# Patient Record
Sex: Male | Born: 1965 | Race: White | Hispanic: No | Marital: Married | Smoking: Never smoker
Health system: Southern US, Community
[De-identification: ages and names within clinical notes are randomized; demographics above are authoritative.]

## PROBLEM LIST (undated history)

## (undated) DIAGNOSIS — Z87442 Personal history of urinary calculi: Secondary | ICD-10-CM

## (undated) DIAGNOSIS — K219 Gastro-esophageal reflux disease without esophagitis: Secondary | ICD-10-CM

## (undated) DIAGNOSIS — K222 Esophageal obstruction: Secondary | ICD-10-CM

## (undated) DIAGNOSIS — K649 Unspecified hemorrhoids: Secondary | ICD-10-CM

## (undated) DIAGNOSIS — K5732 Diverticulitis of large intestine without perforation or abscess without bleeding: Secondary | ICD-10-CM

## (undated) DIAGNOSIS — K5790 Diverticulosis of intestine, part unspecified, without perforation or abscess without bleeding: Secondary | ICD-10-CM

## (undated) DIAGNOSIS — K635 Polyp of colon: Secondary | ICD-10-CM

## (undated) HISTORY — DX: Esophageal obstruction: K22.2

## (undated) HISTORY — DX: Unspecified hemorrhoids: K64.9

## (undated) HISTORY — PX: ROTATOR CUFF REPAIR: SHX139

## (undated) HISTORY — DX: Gastro-esophageal reflux disease without esophagitis: K21.9

## (undated) HISTORY — DX: Polyp of colon: K63.5

## (undated) HISTORY — DX: Diverticulitis of large intestine without perforation or abscess without bleeding: K57.32

## (undated) HISTORY — PX: HEMORRHOID BANDING: SHX5850

## (undated) HISTORY — DX: Diverticulosis of intestine, part unspecified, without perforation or abscess without bleeding: K57.90

---
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
## 2003-05-01 ENCOUNTER — Ambulatory Visit (HOSPITAL_COMMUNITY): Admission: RE | Admit: 2003-05-01 | Discharge: 2003-05-01 | Payer: Self-pay | Admitting: Pulmonary Disease

## 2003-05-20 ENCOUNTER — Ambulatory Visit (HOSPITAL_COMMUNITY): Admission: RE | Admit: 2003-05-20 | Discharge: 2003-05-20 | Payer: Self-pay | Admitting: Pulmonary Disease

## 2007-12-25 ENCOUNTER — Ambulatory Visit: Payer: Self-pay | Admitting: Internal Medicine

## 2007-12-30 ENCOUNTER — Ambulatory Visit (HOSPITAL_COMMUNITY): Admission: RE | Admit: 2007-12-30 | Discharge: 2007-12-30 | Payer: Self-pay | Admitting: Internal Medicine

## 2007-12-30 ENCOUNTER — Ambulatory Visit: Payer: Self-pay | Admitting: Internal Medicine

## 2007-12-30 HISTORY — PX: ESOPHAGOGASTRODUODENOSCOPY: SHX1529

## 2007-12-30 HISTORY — PX: COLONOSCOPY: SHX174

## 2009-01-21 ENCOUNTER — Encounter: Payer: Self-pay | Admitting: Urgent Care

## 2010-05-07 ENCOUNTER — Encounter: Payer: Self-pay | Admitting: Pulmonary Disease

## 2010-08-30 NOTE — Op Note (Signed)
NAME:  Juan Griffith, Juan Griffith                ACCOUNT NO.:  [id number]   MEDICAL RECORD NO.:  [id number]          PATIENT TYPE:  AMB   LOCATION:  DAY                           FACILITY:  [facility]   PHYSICIAN:  R. Roetta Sessions, M.D. DATE OF BIRTH:  [birth year]   DATE OF PROCEDURE:  12/30/2007  DATE OF DISCHARGE:                               OPERATIVE REPORT   INDICATIONS FOR PROCEDURE:  A 45 year old gentleman with a recent  episode of blood per rectum, had a couple of episodes, has not had any  further bleeding.  He was having some crampy lower abdominal pain as  well.  These symptoms have subsided.  He also has longstanding  gastroesophageal reflux disease for which he takes Prilosec and Zantac  as needed.  He also takes Advil and BC Powders on occasion.   Diagnostic/screening colonoscopy now being performed.  Mr. Zunker  describes esophageal dysphagia intermittently to solid food and tells me  his brother had to perform a Heimlich maneuver on him at one point in  time and consequently if a lesion is found, at minimum, we will perform  dilation.  Risks, benefits, and alternatives of this approach have been  reviewed.  He is also undergoing diagnostic colonoscopy to further  evaluate hematochezia.  This approach has also been discussed, and all  parties questions answered.   PROCEDURE NOTE:  O2 saturation, blood pressure, pulse, and respirations  were monitored throughout the entire procedure.   CONSCIOUS SEDATION:  Versed 6 mg IV and Demerol 100 mg IV in divided  doses.  Cetacaine spray for topical pharyngeal anesthesia.   FINDINGS:  Examination of tubular esophagus revealed a Schatzki ring and  superimposed esophageal erosions straddling the EG junction.  There were  2 areas of deep erosion, superficial ulceration extending up 3 cm above  the EG junction.  There was no Barrett esophagus.  No evidence of  neoplasm.  EG junction was easily traversed with a diagnostic scope.   Stomach:   Gastric cavity was emptied, insufflated well with air.  Thorough examination of the gastric mucosa including retroflexion of the  proximal stomach esophagogastric junction demonstrated only a small  hiatal hernia.  Remainder of gastric mucosa appeared unremarkable.  Pylorus patent, easily traversed.  Examination of the bulb and second  portion revealed no abnormalities.   Therapeutic/Diagnostic Maneuvers Performed:  Scope was withdrawn.  A 56-  French Maloney dilator was passed to full insertion with ease.  A look  back revealed the ring had been dilated and superficial tear just above  the lower esophageal sphincter, otherwise no apparent complication  related to passage of the dilator.  The patient tolerated the procedure  well and was prepared  for colonoscopy.  Digital rectal exam revealed no  abnormalities.   Endoscopic Findings:  Prep was adequate.   Colon:  Colonic mucosa was surveyed from the rectosigmoid junction  through the left transverse, right colon through the appendiceal  orifice, ileocecal valve, and cecum.  These structures were well seen  and photographed for the record.  Terminal ileum was intubated, 10 cm  from this  level scope was slowly and cautiously withdrawn.  All previous  mentioned mucosal surfaces were again seen.  The patient had extensive  left-sided diverticulum and colonic mucosa appeared normal as did the  terminal ileal mucosa.  Scope was pulled down the rectum.  Thorough  examination of the rectal mucosa including retroflexed view of the anal  verge demonstrated only minimal anal canal hemorrhoids.  The patient  tolerated both procedures well, was reacted in endoscopy.   IMPRESSION:  1. Esophagogastroduodenoscopy.  Schatzki ring with superimposed distal      esophageal erosions/superficial ulcerations consistent with      erosive/ulcerative reflux esophagitis status post dilation of      Schatzki ring as described above, otherwise unremarkable  esophagus,      small hiatal hernia.  Otherwise normal stomach, D1 and D2.      Colonoscopy findings normal rectum aside from minimal anal canal      hemorrhoids.  2. Extensive left-sided diverticulum.  Colonic mucosa appeared normal,      terminal ileum.   RECOMMENDATIONS:  1. Stop Prilosec and p.r.n. Zantac.  2. Begin Zegerid 40 mg 1 capsule daily before breakfast, prescription      given.  Antireflux literature provided to Mr. Mcmanaman.  3. Diverticulosis literature provided to Mr. Lupo.  He also is to      take daily fiber supplement.   Etiology was self-limiting hematochezia is not clear.  He certainly  could have had an episode of ischemic or infectious colitis.  In  addition, diverticular bleeds were not excluded either.   NSAID effect could also be a contributing factor.   We will plan to see Mr. Arwood back in the office in 1 month and see how  he is doing from there.      Jonathon Bellows, M.D.  Electronically Signed     RMR/MEDQ  D:  12/30/2007  T:  12/31/2007  Job:  [id number]   cc:   Ramon Dredge L. Juanetta Gosling, M.D.  Fax: [phone]

## 2010-08-30 NOTE — Consult Note (Signed)
NAME:  Juan Griffith, Juan Griffith                ACCOUNT NO.:  [id number]   MEDICAL RECORD NO.:  [id number]          PATIENT TYPE:  AMB   LOCATION:  DAY                           FACILITY:  [facility]   PHYSICIAN:  R. Roetta Sessions, M.D. DATE OF BIRTH:  05-25-1965   DATE OF CONSULTATION:  DATE OF DISCHARGE:                                 CONSULTATION   REQUESTING PHYSICIAN:  Dr. Juanetta Gosling.   CONSULTING PHYSICIAN:  R. Roetta Sessions, MD   REASON FOR CONSULTATION:  Hematochezia.   HISTORY OF PRESENT ILLNESS:  Mr. Parkerson is a 45 year old Caucasian male.  He has had 2 episodes of large volume hematochezia, first was about 6  weeks ago and second about 2 weeks ago, where he noticed a large amount  of bright red blood in the commode as well as on the toilet paper.  He  tells me he has had stuttering small-volume hematochezia on the toilet  paper for a while now, but these 2 large episodes is what made him come  to the doctor.  He was seen by Dr. Juanetta Gosling.  He has had some lab work  and for some we do not have the results.  He has diarrhea at least half  of the week.  He will have anywhere from 3-5 loose stools that are  watery per day.  He also has cramp and intermittent knife-like low  abdominal pain at times that is usually resolved with defecation.  He  has had some transient nausea.  Denies any vomiting.  He has almost  daily heartburn and indigestion most weeks, other weeks he only has  episodes a couple of times per week.  He takes on demand Prilosec and/or  Zantac.  He uses a couple of Advil per month and rare BC powders.  He  complains of anorexia since the episode of rectal bleeding.  His weight  has remained stable.  He has had chronic reflux symptoms for a couple of  years now.   PAST MEDICAL AND SURGICAL HISTORY:  Chronic GERD.  He has had no  surgery.   CURRENT MEDICATIONS:  1. Prilosec 20 mg p.r.n.  2. Zantac 75 mg p.r.n.  3. Advil 200 mg p.r.n.  4. BC powders p.r.n.   ALLERGIES:   CODEINE and SULFA.   FAMILY HISTORY:  There is no known family history of colorectal  carcinoma, liver, or chronic GI problems.  Mother has diabetes mellitus.  Father is healthy.  He has 1 healthy brother.   SOCIAL HISTORY:  Mr. Dorner is married.  He has 2 healthy children.  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  He has dipped  tobacco for about 25 years now.  He quit drinking alcohol 13 years ago  after drinking heavily and having episodes of hematemesis.  He denies  any drug use.   REVIEW OF SYSTEMS:  See HPI, otherwise negative.   PHYSICAL EXAMINATION:  VITAL SIGNS:  Weight 212 pounds, height 73-93  inches, blood pressure 112/80, and pulse 60.  GENERAL:  Mr. Monica is well-developed and well-nourished Caucasian male  in no acute distress.  HEENT:  Sclerae clear, nonicteric. Conjunctivae pink. Oropharynx pink  and moist without any lesions.  NECK:  Supple without mass or thyromegaly.  CHEST:  Heart regular rhythm.  Normal S1 and S2.  No murmurs, clicks,  rubs, or gallops.  LUNGS:  Clear to auscultation bilaterally.  ABDOMEN:  Positive bowel sounds x4.  No bruits auscultated.  Soft,  nontender, nondistended without palpable mass or hepatosplenomegaly.  No  rebound tenderness or guarding.  EXTREMITIES:  Without clubbing or edema.  RECTAL:  Deferred.   IMPRESSION:  Mr. Byrns is a 45 year old Caucasian male who has had 2  episodes of large volume hematochezia.  He also has chronic intermittent  diarrhea with some cramp-like abdominal pain.  He has also had chronic  gastroesophageal reflux disease and has been taking p.r.n. proton pump  inhibitor and H2 blockers.   He is going to require further evaluation to determine etiology of his  hematochezia, which could be due to benign anorectal source such as  hemorrhoids, fissure, or diverticular bleeding, however, we do need to  rule out colorectal carcinoma.   As far as his chronic gastroesophageal reflux  disease is concerned, he  should have screening for Barrett and also given his frequent symptoms,  he may benefit from daily PPI therapy, since he is having symptoms more  days than not.   PLAN:  1. We would consider daily PPI, pending EGD.  2. Diagnostic colonoscopy and screening EGD with Dr. Jena Gauss in the near      future.  I discussed both procedures including risks and benefits,      including but not limited to bleeding, infection, perforation, and      drug reaction.  He agreed with the plan and consent was obtained.  3. We will request recent labs from Dr. Juanetta Gosling office for review.   Thank you Dr. Juanetta Gosling for allowing [place] to participate in the care of Mr.  Frith.        Lorenza Burton, N.P.      Jonathon Bellows, M.D.  Electronically Signed    KJ/MEDQ  D:  12/25/2007  T:  12/25/2007  Job:  [id number]

## 2013-04-22 ENCOUNTER — Encounter: Payer: Self-pay | Admitting: Gastroenterology

## 2013-04-22 ENCOUNTER — Ambulatory Visit (INDEPENDENT_AMBULATORY_CARE_PROVIDER_SITE_OTHER): Payer: 59 | Admitting: Gastroenterology

## 2013-04-22 ENCOUNTER — Encounter (INDEPENDENT_AMBULATORY_CARE_PROVIDER_SITE_OTHER): Payer: Self-pay

## 2013-04-22 ENCOUNTER — Other Ambulatory Visit: Payer: Self-pay | Admitting: Internal Medicine

## 2013-04-22 VITALS — BP 125/78 | HR 71 | Temp 97.6°F | Ht 73.0 in | Wt 223.0 lb

## 2013-04-22 DIAGNOSIS — K625 Hemorrhage of anus and rectum: Secondary | ICD-10-CM | POA: Insufficient documentation

## 2013-04-22 DIAGNOSIS — K219 Gastro-esophageal reflux disease without esophagitis: Secondary | ICD-10-CM | POA: Insufficient documentation

## 2013-04-22 LAB — CBC WITH DIFFERENTIAL/PLATELET
Basophils Absolute: 0 10*3/uL (ref 0.0–0.1)
Basophils Relative: 0 % (ref 0–1)
Eosinophils Absolute: 0.3 10*3/uL (ref 0.0–0.7)
Eosinophils Relative: 3 % (ref 0–5)
HCT: 40.7 % (ref 39.0–52.0)
Hemoglobin: 14.3 g/dL (ref 13.0–17.0)
Lymphocytes Relative: 28 % (ref 12–46)
Lymphs Abs: 2.4 10*3/uL (ref 0.7–4.0)
MCH: 30.8 pg (ref 26.0–34.0)
MCHC: 35.1 g/dL (ref 30.0–36.0)
MCV: 87.7 fL (ref 78.0–100.0)
Monocytes Absolute: 0.8 10*3/uL (ref 0.1–1.0)
Monocytes Relative: 10 % (ref 3–12)
Neutro Abs: 5.1 10*3/uL (ref 1.7–7.7)
Neutrophils Relative %: 59 % (ref 43–77)
Platelets: 259 10*3/uL (ref 150–400)
RBC: 4.64 MIL/uL (ref 4.22–5.81)
RDW: 13.7 % (ref 11.5–15.5)
WBC: 8.6 10*3/uL (ref 4.0–10.5)

## 2013-04-22 MED ORDER — PEG 3350-KCL-NA BICARB-NACL 420 G PO SOLR: 4000.0000 mL | ORAL | Status: DC

## 2013-04-22 MED ORDER — OMEPRAZOLE MAGNESIUM 20 MG PO TBEC: 20.0000 mg | DELAYED_RELEASE_TABLET | Freq: Every day | ORAL | Status: AC

## 2013-04-22 NOTE — Progress Notes (Signed)
cc'd to pcp 

## 2013-04-22 NOTE — Assessment & Plan Note (Signed)
Well-controlled with over-the-counter PPI therapy. Patient has taken Zegerid OTC up to 3 daily. Advised against this given the significant amount of sodium bicarbonate consumption at this dose. Advised that he consider Prilosec OTC, Prevacid 24 hour, or Nexium OTC one in the morning and one in the evening.

## 2013-04-22 NOTE — Patient Instructions (Addendum)
1. We have scheduled you for a colonoscopy with Dr. Gala Romney. See separate instructions.  2. Stop taking too much Zegerid OTC due to excessive salt intake. Try Prilosec, Prevacid, Nexium over the counter.  3. Please have your blood work done.

## 2013-04-22 NOTE — Progress Notes (Signed)
Primary Care Physician:  Alonza Bogus, MD  Primary Gastroenterologist:  Garfield Cornea, MD   Chief Complaint  Patient presents with  . Rectal Bleeding  . Abdominal Pain    HPI:  Juan Griffith is a 48 y.o. male here frequent hematachezia. Some large volume bleeding with clots. Has had bleeding without any warning and ruined clothing. BM 2-3 is normal for him. Right now 5-6 stools, but mostly blood. Bleeding has increased over the past 2 months. Notes that he has bleeding almost 10 months per year. No constipation. Stool formed to loose. Some cramping in abdomen with this. No heartburn on Zegerid OTC 20mg  2 in the morning and one at night. Rare abdominal pain sometimes more on the left. No swallowing problems. Denies any weight loss. No family history of colon cancer or inflammatory bowel disease.  Current Outpatient Prescriptions  Medication Sig Dispense Refill  . Omeprazole-Sodium Bicarbonate (ZEGERID OTC PO) Take by mouth 3 (three) times daily.       No current facility-administered medications for this visit.    Allergies as of 04/22/2013 - Review Complete 04/22/2013  Allergen Reaction Noted  . Codeine  04/22/2013  . Sulfa antibiotics  04/22/2013    Past Medical History  Diagnosis Date  . GERD (gastroesophageal reflux disease)   . Diverticulosis   . Schatzki's ring     s/p dilation 2009    Past Surgical History  Procedure Laterality Date  . Colonoscopy  12/30/2007    [facility]:ENIDPO rectum aside from minimal anal canal hemorrhoids/Extensive left-sided diverticulum.  Colonic mucosa appeared normal  . Esophagogastroduodenoscopy  12/30/2007    [facility]:PNTIRWER ring with superimposed distal esophageal erosions/superficial ulcerations consistent with erosive/ulcerative reflux esophagitis status post dilation of Schatzki ring /small HH    Family History  Problem Relation Age of Onset  . Colon cancer Neg Hx     History   Social History  . Marital Status: Married    Spouse  Name: N/A    Number of Children: 2  . Years of Education: N/A   Occupational History  . electrician    Social History Main Topics  . Smoking status: Never Smoker   . Smokeless tobacco: Current User     Comment: dips  . Alcohol Use: Yes     Comment: every 6 months   . Drug Use: No  . Sexual Activity: Not on file   Other Topics Concern  . Not on file   Social History Narrative  . No narrative on file      ROS:  General: Negative for anorexia, weight loss, fever, chills, fatigue, weakness. Eyes: Negative for vision changes.  ENT: Negative for hoarseness, difficulty swallowing , nasal congestion. CV: Negative for chest pain, angina, palpitations, dyspnea on exertion, peripheral edema.  Respiratory: Negative for dyspnea at rest, dyspnea on exertion, cough, sputum, wheezing.  GI: See history of present illness. GU:  Negative for dysuria, hematuria, urinary incontinence, urinary frequency, nocturnal urination.  MS: Negative for joint pain, low back pain.  Derm: Negative for rash or itching.  Neuro: Negative for weakness, abnormal sensation, seizure, frequent headaches, memory loss, confusion.  Psych: Negative for anxiety, depression, suicidal ideation, hallucinations.  Endo: Negative for unusual weight change.  Heme: Negative for bruising or bleeding. Allergy: Negative for rash or hives.    Physical Examination:  BP 125/78  Pulse 71  Temp(Src) 97.6 F (36.4 C) (Oral)  Ht 6\' 1"  (1.854 m)  Wt 223 lb (101.152 kg)  BMI 29.43 kg/m2   General:  Well-nourished, well-developed in no acute distress.  Head: Normocephalic, atraumatic.   Eyes: Conjunctiva pink, no icterus. Mouth: Oropharyngeal mucosa moist and pink , no lesions erythema or exudate. Neck: Supple without thyromegaly, masses, or lymphadenopathy.  Lungs: Clear to auscultation bilaterally.  Heart: Regular rate and rhythm, no murmurs rubs or gallops.  Abdomen: Bowel sounds are normal, minimal left lower quadrant  tenderness, nondistended, no hepatosplenomegaly or masses, no abdominal bruits or    hernia , no rebound or guarding.   Rectal: Deferred Extremities: No lower extremity edema. No clubbing or deformities.  Neuro: Alert and oriented x 4 , grossly normal neurologically.  Skin: Warm and dry, no rash or jaundice.   Psych: Alert and cooperative, normal mood and affect.

## 2013-04-22 NOTE — Assessment & Plan Note (Addendum)
48 y/o male with recurrent large volume hematochezia associated with minimal abdominal pain, mostly left sided. Frequency of bleeding has increased over the past couple of months. A 5-6 bloody bowel movements daily. Often passes only blood. Complains of passing small blood clots. Last colonoscopy 2009, at which time he had dense left-sided diverticula and minimal internal hemorrhoids. Question recurrent diverticular bleed. Need to exclude IBD or malignancy. Evaluate hemorrhoids for possible banding. Recommend colonoscopy at this time for further evaluation.  I have discussed the risks, alternatives, benefits with regards to but not limited to the risk of reaction to medication, bleeding, infection, perforation and the patient is agreeable to proceed. Written consent to be obtained.  CBC.

## 2013-04-25 ENCOUNTER — Encounter (HOSPITAL_COMMUNITY)
Admission: RE | Disposition: A | Discharge status: Home / Self Care | Payer: Self-pay | Source: Ambulatory Visit | Attending: Internal Medicine

## 2013-04-25 ENCOUNTER — Ambulatory Visit (HOSPITAL_COMMUNITY)
Admission: RE | Admit: 2013-04-25 | Discharge: 2013-04-25 | Disposition: A | Discharge status: Home / Self Care | Payer: 59 | Source: Ambulatory Visit | Attending: Internal Medicine | Admitting: Internal Medicine

## 2013-04-25 ENCOUNTER — Encounter (HOSPITAL_COMMUNITY): Payer: Self-pay | Admitting: *Deleted

## 2013-04-25 DIAGNOSIS — K625 Hemorrhage of anus and rectum: Secondary | ICD-10-CM

## 2013-04-25 DIAGNOSIS — K921 Melena: Secondary | ICD-10-CM

## 2013-04-25 DIAGNOSIS — D129 Benign neoplasm of anus and anal canal: Principal | ICD-10-CM

## 2013-04-25 DIAGNOSIS — K5732 Diverticulitis of large intestine without perforation or abscess without bleeding: Secondary | ICD-10-CM

## 2013-04-25 DIAGNOSIS — K648 Other hemorrhoids: Secondary | ICD-10-CM | POA: Insufficient documentation

## 2013-04-25 DIAGNOSIS — D128 Benign neoplasm of rectum: Secondary | ICD-10-CM | POA: Insufficient documentation

## 2013-04-25 DIAGNOSIS — K219 Gastro-esophageal reflux disease without esophagitis: Secondary | ICD-10-CM

## 2013-04-25 DIAGNOSIS — K5730 Diverticulosis of large intestine without perforation or abscess without bleeding: Secondary | ICD-10-CM | POA: Insufficient documentation

## 2013-04-25 HISTORY — PX: COLONOSCOPY: SHX5424

## 2013-04-25 SURGERY — COLONOSCOPY
Anesthesia: Moderate Sedation

## 2013-04-25 MED ORDER — SODIUM CHLORIDE 0.9 % IV SOLN
INTRAVENOUS | Status: DC
  Administered 2013-04-25: 12:00:00 via INTRAVENOUS

## 2013-04-25 MED ORDER — MIDAZOLAM HCL 5 MG/5ML IJ SOLN
INTRAMUSCULAR | Status: DC | PRN
  Administered 2013-04-25: 2 mg via INTRAVENOUS
  Administered 2013-04-25 (×2): 1 mg via INTRAVENOUS
  Administered 2013-04-25: 2 mg via INTRAVENOUS

## 2013-04-25 MED ORDER — MIDAZOLAM HCL 5 MG/5ML IJ SOLN
INTRAMUSCULAR | Status: AC
  Filled 2013-04-25: qty 10

## 2013-04-25 MED ORDER — ONDANSETRON HCL 4 MG/2ML IJ SOLN
INTRAMUSCULAR | Status: AC
  Filled 2013-04-25: qty 2

## 2013-04-25 MED ORDER — STERILE WATER FOR IRRIGATION IR SOLN
Status: DC | PRN
  Administered 2013-04-25: 13:00:00

## 2013-04-25 MED ORDER — MEPERIDINE HCL 100 MG/ML IJ SOLN
INTRAMUSCULAR | Status: DC | PRN
  Administered 2013-04-25: 25 mg via INTRAVENOUS
  Administered 2013-04-25 (×2): 50 mg via INTRAVENOUS

## 2013-04-25 MED ORDER — MEPERIDINE HCL 100 MG/ML IJ SOLN
INTRAMUSCULAR | Status: AC
  Filled 2013-04-25: qty 2

## 2013-04-25 NOTE — Op Note (Signed)
[place], [address]   COLONOSCOPY PROCEDURE REPORT  PATIENT: Juan Griffith, Juan Griffith  MR#:         [id number] BIRTHDATE: [birth year] , 72  yrs. old GENDER: Male ENDOSCOPIST: R.  Garfield Cornea, MD FACP FACG REFERRED BY:  Sinda Du, M.D. PROCEDURE DATE:  04/25/2013 PROCEDURE:     Colonoscopy with biopsy  INDICATIONS: hematochezia with entirely normal H&H and white count recently  INFORMED CONSENT:  The risks, benefits, alternatives and imponderables including but not limited to bleeding, perforation as well as the possibility of a missed lesion have been reviewed.  The potential for biopsy, lesion removal, etc. have also been discussed.  Questions have been answered.  All parties agreeable. Please see the history and physical in the medical record for more information.  MEDICATIONS: Versed 6 mg IV and Demerol 125 mg IV in divided doses. Zofran 4 mg IV  DESCRIPTION OF PROCEDURE:  After a digital rectal exam was performed, the EC-[id number] ([id number])  colonoscope was advanced from the anus through the rectum and colon to the area of the cecum, ileocecal valve and appendiceal orifice.  The cecum was deeply intubated.  These structures were well-seen and photographed for the record.  From the level of the cecum and ileocecal valve, the scope was slowly and cautiously withdrawn.  The mucosal surfaces were carefully surveyed utilizing scope tip deflection to facilitate fold flattening as needed.  The scope was pulled down into the rectum where a thorough examination including retroflexion was performed.    FINDINGS:  Adequate preparation. Normal rectum aside from inflamed anal canal and internal hemorrhoids. Somewhat tight sphincter time. (1) diminutive rectosigmoid polyp. Patient had extensive left-sided diverticula.  There was a 10-15 segment band of abnormal sigmoid colon mucosa. Mucosa appeared swollen and some pus noted to be coming out of  a couple of tics at this level. I did not see anything concerning for a neoplastic process. More proximally, the remainder of the colonic mucosa to the cecum appeared entirely normal.  THERAPEUTIC / DIAGNOSTIC MANEUVERS PERFORMED:  The above-mentioned polyp was cold biopsied/removed  COMPLICATIONS: None  CECAL WITHDRAWAL TIME:  7 minute  IMPRESSION:  Engorged internal hemorrhoids  - likely source of hematochezia. Diminutive rectosigmoid polyp  - status post cold biopsy removal Endoscopic findings consistent with left-sided diverticulosis and sigmoid diverticulitis  RECOMMENDATIONS: Low residue diet. Ten-day course of oral Cipro and Flagyl.  Appointment in the office for hemorrhoid banding in about 4 weeks from now. Followup on pathology.   _______________________________ eSigned:  R. Garfield Cornea, MD FACP [facility] 04/25/2013 1:47 PM   CC:    PATIENT NAME:  Noeh, Sparacino MR#: [id number]

## 2013-04-25 NOTE — Discharge Instructions (Addendum)
Colonoscopy Discharge Instructions  Read the instructions outlined below and refer to this sheet in the next few weeks. These discharge instructions provide you with general information on caring for yourself after you leave the hospital. Your doctor may also give you specific instructions. While your treatment has been planned according to the most current medical practices available, unavoidable complications occasionally occur. If you have any problems or questions after discharge, call Dr. Gala Romney at [phone]. ACTIVITY  You may resume your regular activity, but move at a slower pace for the next 24 hours.   Take frequent rest periods for the next 24 hours.   Walking will help get rid of the air and reduce the bloated feeling in your belly (abdomen).   No driving for 24 hours (because of the medicine (anesthesia) used during the test).    Do not sign any important legal documents or operate any machinery for 24 hours (because of the anesthesia used during the test).  NUTRITION  Drink plenty of fluids.   You may resume your normal diet as instructed by your doctor.   Begin with a light meal and progress to your normal diet. Heavy or fried foods are harder to digest and may make you feel sick to your stomach (nauseated).   Avoid alcoholic beverages for 24 hours or as instructed.  MEDICATIONS  You may resume your normal medications unless your doctor tells you otherwise.  WHAT YOU CAN EXPECT TODAY  Some feelings of bloating in the abdomen.   Passage of more gas than usual.   Spotting of blood in your stool or on the toilet paper.  IF YOU HAD POLYPS REMOVED DURING THE COLONOSCOPY:  No aspirin products for 7 days or as instructed.   No alcohol for 7 days or as instructed.   Eat a soft diet for the next 24 hours.  FINDING OUT THE RESULTS OF YOUR TEST Not all test results are available during your visit. If your test results are not back during the visit, make an appointment  with your caregiver to find out the results. Do not assume everything is normal if you have not heard from your caregiver or the medical facility. It is important for you to follow up on all of your test results.  SEEK IMMEDIATE MEDICAL ATTENTION IF:  You have more than a spotting of blood in your stool.   Your belly is swollen (abdominal distention).   You are nauseated or vomiting.   You have a temperature over 101.   You have abdominal pain or discomfort that is severe or gets worse throughout the day.    Hemorrhoid, polyp, diverticulosis and diverticulitis information provided  Low residue diet  Begin Cipro 500 mg orally twice daily x10 days and Flagyl 500 mg orally 3 times daily for 10 days  Further recommendations to follow pending review of pathology report  Office visit with [place] in 4 weeks for hemorrhoid banding  Hemorrhoids Hemorrhoids are swollen veins around the rectum or anus. There are two types of hemorrhoids:   Internal hemorrhoids. These occur in the veins just inside the rectum. They may poke through to the outside and become irritated and painful.  External hemorrhoids. These occur in the veins outside the anus and can be felt as a painful swelling or hard lump near the anus. CAUSES  Pregnancy.   Obesity.   Constipation or diarrhea.   Straining to have a bowel movement.   Sitting for long periods on the toilet.  Heavy  lifting or other activity that caused you to strain.  Anal intercourse. SYMPTOMS   Pain.   Anal itching or irritation.   Rectal bleeding.   Fecal leakage.   Anal swelling.   One or more lumps around the anus.  DIAGNOSIS  Your caregiver may be able to diagnose hemorrhoids by visual examination. Other examinations or tests that may be performed include:   Examination of the rectal area with a gloved hand (digital rectal exam).   Examination of anal canal using a small tube (scope).   A blood test if you have  lost a significant amount of blood.  A test to look inside the colon (sigmoidoscopy or colonoscopy). TREATMENT Most hemorrhoids can be treated at home. However, if symptoms do not seem to be getting better or if you have a lot of rectal bleeding, your caregiver may perform a procedure to help make the hemorrhoids get smaller or remove them completely. Possible treatments include:   Placing a rubber band at the base of the hemorrhoid to cut off the circulation (rubber band ligation).   Injecting a chemical to shrink the hemorrhoid (sclerotherapy).   Using a tool to burn the hemorrhoid (infrared light therapy).   Surgically removing the hemorrhoid (hemorrhoidectomy).   Stapling the hemorrhoid to block blood flow to the tissue (hemorrhoid stapling).  HOME CARE INSTRUCTIONS   Eat foods with fiber, such as whole grains, beans, nuts, fruits, and vegetables. Ask your doctor about taking products with added fiber in them (fibersupplements).  Increase fluid intake. Drink enough water and fluids to keep your urine clear or pale yellow.   Exercise regularly.   Go to the bathroom when you have the urge to have a bowel movement. Do not wait.   Avoid straining to have bowel movements.   Keep the anal area dry and clean. Use wet toilet paper or moist towelettes after a bowel movement.   Medicated creams and suppositories may be used or applied as directed.   Only take over-the-counter or prescription medicines as directed by your caregiver.   Take warm sitz baths for 15 20 minutes, 3 4 times a day to ease pain and discomfort.   Place ice packs on the hemorrhoids if they are tender and swollen. Using ice packs between sitz baths may be helpful.   Put ice in a plastic bag.   Place a towel between your skin and the bag.   Leave the ice on for 15 20 minutes, 3 4 times a day.   Do not use a donut-shaped pillow or sit on the toilet for long periods. This increases blood  pooling and pain.  SEEK MEDICAL CARE IF:  You have increasing pain and swelling that is not controlled by treatment or medicine.  You have uncontrolled bleeding.  You have difficulty or you are unable to have a bowel movement.  You have pain or inflammation outside the area of the hemorrhoids. MAKE SURE YOU:  Understand these instructions.  Will watch your condition.  Will get help right away if you are not doing well or get worse.  Diverticulosis Diverticulosis is a common condition that develops when small pouches (diverticula) form in the wall of the colon. The risk of diverticulosis increases with age. It happens more often in people who eat a low-fiber diet. Most individuals with diverticulosis have no symptoms. Those individuals with symptoms usually experience abdominal pain, constipation, or loose stools (diarrhea). HOME CARE INSTRUCTIONS   Increase the amount of fiber in your diet  as directed by your caregiver or dietician. This may reduce symptoms of diverticulosis.  Your caregiver may recommend taking a dietary fiber supplement.  Drink at least 6 to 8 glasses of water each day to prevent constipation.  Try not to strain when you have a bowel movement.  Your caregiver may recommend avoiding nuts and seeds to prevent complications, although this is still an uncertain benefit.  Only take over-the-counter or prescription medicines for pain, discomfort, or fever as directed by your caregiver. FOODS WITH HIGH FIBER CONTENT INCLUDE:  Fruits. Apple, peach, pear, tangerine, raisins, prunes.  Vegetables. Brussels sprouts, asparagus, broccoli, cabbage, carrot, cauliflower, romaine lettuce, spinach, summer squash, tomato, winter squash, zucchini.  Starchy Vegetables. Baked beans, kidney beans, lima beans, split peas, lentils, potatoes (with skin).  Grains. Whole wheat bread, brown rice, bran flake cereal, plain oatmeal, white rice, shredded wheat, bran muffins. SEEK  IMMEDIATE MEDICAL CARE IF:   You develop increasing pain or severe bloating.  You have an oral temperature above 102 F (38.9 C), not controlled by medicine.  You develop vomiting or bowel movements that are bloody or black.  Diverticulitis A diverticulum is a small pouch or sac on the colon. Diverticulosis is the presence of these diverticula on the colon. Diverticulitis is the irritation (inflammation) or infection of diverticula. CAUSES  The colon and its diverticula contain bacteria. If food particles block the tiny opening to a diverticulum, the bacteria inside can grow and cause an increase in pressure. This leads to infection and inflammation and is called diverticulitis. SYMPTOMS   Abdominal pain and tenderness. Usually, the pain is located on the left side of your abdomen. However, it could be located elsewhere.  Fever.  Bloating.  Feeling sick to your stomach (nausea).  Throwing up (vomiting).  Abnormal stools. DIAGNOSIS  Your caregiver will take a history and perform a physical exam. Since many things can cause abdominal pain, other tests may be necessary. Tests may include:  Blood tests.  Urine tests.  X-ray of the abdomen.  CT scan of the abdomen. Sometimes, surgery is needed to determine if diverticulitis or other conditions are causing your symptoms. TREATMENT  Most of the time, you can be treated without surgery. Treatment includes:  Resting the bowels by only having liquids for a few days. As you improve, you will need to eat a low-fiber diet.  Intravenous (IV) fluids if you are losing body fluids (dehydrated).  Antibiotic medicines that treat infections may be given.  Pain and nausea medicine, if needed.  Surgery if the inflamed diverticulum has burst. HOME CARE INSTRUCTIONS   Try a clear liquid diet (broth, tea, or water for as long as directed by your caregiver). You may then gradually begin a low-fiber diet as tolerated.  A low-fiber diet is a  diet with less than 10 grams of fiber. Choose the foods below to reduce fiber in the diet:  White breads, cereals, rice, and pasta.  Cooked fruits and vegetables or soft fresh fruits and vegetables without the skin.  Ground or well-cooked tender beef, ham, veal, lamb, pork, or poultry.  Eggs and seafood.  After your diverticulitis symptoms have improved, your caregiver may put you on a high-fiber diet. A high-fiber diet includes 14 grams of fiber for every 1000 calories consumed. For a standard 2000 calorie diet, you would need 28 grams of fiber. Follow these diet guidelines to help you increase the fiber in your diet. It is important to slowly increase the amount fiber in your diet  to avoid gas, constipation, and bloating.  Choose whole-grain breads, cereals, pasta, and brown rice.  Choose fresh fruits and vegetables with the skin on. Do not overcook vegetables because the more vegetables are cooked, the more fiber is lost.  Choose more nuts, seeds, legumes, dried peas, beans, and lentils.  Look for food products that have greater than 3 grams of fiber per serving on the Nutrition Facts label.  Take all medicine as directed by your caregiver.  If your caregiver has given you a follow-up appointment, it is very important that you go. Not going could result in lasting (chronic) or permanent injury, pain, and disability. If there is any problem keeping the appointment, call to reschedule. SEEK MEDICAL CARE IF:   Your pain does not improve.  You have a hard time advancing your diet beyond clear liquids.  Your bowel movements do not return to normal. SEEK IMMEDIATE MEDICAL CARE IF:   Your pain becomes worse.  You have an oral temperature above 102 F (38.9 C), not controlled by medicine.  You have repeated vomiting.  You have bloody or black, tarry stools.  Symptoms that brought you to your caregiver become worse or are not getting better. MAKE SURE YOU:   Understand these  instructions.  Will watch your condition.  Will get help right away if you are not doing well or get worse.  Low-Fiber Diet Fiber is found in fruits, vegetables, and grains. A low-fiber diet restricts fibrous foods that are not digested in the small intestine. A diet containing about 10 grams of fiber is considered low fiber.  PURPOSE  To prevent blockage of a partially obstructed or narrowed gastrointestinal tract.  To reduce fecal weight and volume.  To slow the movement of feces. WHEN IS THIS DIET USED?  It may be used during the acute phase of Crohn disease, ulcerative colitis, regional enteritis, or diverticulitis.  It may be used if your intestinal or esophageal tubes are narrowing (stenosis).  It may be used as a transitional diet following surgery, injury (trauma), or illness. CHOOSING FOODS Check labels, especially on foods from the starch list. Often times, dietary fiber content is listed on the nutrition facts panel. Please ask your Registered Dietitian if you have questions about specific foods that are related to your condition, especially if the food is not listed on this handout. Breads and Starches  Allowed: White, [place], and pita breads, plain rolls, buns, or sweet rolls, doughnuts, waffles, pancakes, bagels. Plain muffins, biscuits, matzoth. Soda, saltine, graham crackers. Pretzels, rusks, melba toast, zwieback. Cooked cereals: cornmeal, farina, or cream cereals. Dry cereals: refined corn, wheat, rice, and oat cereals (check label). Potatoes prepared any way without skins, refined macaroni, spaghetti, noodles, refined rice.  Avoid: Whole-wheat bread, rolls, and crackers. Multigrains, rye, bran seeds, nuts, or coconut. Cereals containing whole grains, multigrains, bran, coconut, nuts, raisins. Cooked or dry oatmeal. Coarse wheat cereals, granola. Cereals advertised as "high fiber." Potato skins. Whole-grain pasta, wild or brown rice. Popcorn. Vegetables  Allowed:  Strained tomato and vegetable juices. Fresh lettuce, cucumber, spinach. Well-cooked or canned: asparagus, bean sprouts, broccoli, cut green beans, cauliflower, pumpkin, beets, mushrooms, yellow squash, tomato, tomato sauce, zucchini, turnips.Keep servings limited to  cup.  Avoid: Fresh, cooked, or canned: artichokes, baked beans, beet greens, Brussels sprouts, corn, kale, legumes, peas, sweet potatoes. Avoid large servings of any vegetables. Fruit  Allowed: All fruit juices except prune juice. Cooked or canned fruits without skin and seeds: apricots, applesauce, cantaloupe, cherries, grapefruit, grapes, kiwi,  mandarin oranges, peaches, pears, fruit cocktail, pineapple, plums, watermelon. Fresh without skin: banana, grapes, cantaloupe, avocado, cherries, pineapple, kiwi, nectarines, peaches, blueberries. Keep servings limited to  cup or 1 piece.  Avoid: Fresh: apples with or without skin, apricots, mangoes, pears, raspberries, strawberries. Prune juice and juices with pulp, stewed or dried prunes. Dried fruits, raisins, dates. Avoid large servings of all fresh fruits. Meat and Protein Substitutes  Allowed: Ground or well-cooked tender beef, ham, veal, lamb, pork, poultry. Eggs, plain cheese. Fish, oysters, shrimp, lobster, other seafood. Liver, organ meats. Smooth nut butters.  Avoid: Tough, fibrous meats with gristle. Chunky nut butter.Cheese with seeds, nuts, or other foods not allowed. Nuts, seeds, legumes, dried peas, beans, lentils. Dairy  Allowed: All milk products except those not allowed.  Avoid: Yogurt or cheese that contains nuts, seeds, or added fruit. Soups and Combination Foods  Allowed: Bouillon, broth, or cream soups made from allowed foods. Any strained soup. Casseroles or mixed dishes made with allowed foods.  Avoid: Soups made from vegetables that are not allowed or that contain other foods not allowed. Desserts and Sweets  Allowed:Plain cakes and cookies, pie made  with allowed fruit, pudding, custard, cream pie. Gelatin, fruit, ice, sherbet, frozen ice pops. Ice cream, ice milk without nuts. Plain hard candy, honey, jelly, molasses, syrup, sugar, chocolate syrup, gumdrops, marshmallows.  Avoid: Desserts, cookies, or candies that contain nuts, peanut butter, dried fruits. Jams, preserves with seeds, marmalade. Fats and Oils  Allowed:Margarine, butter, cream, mayonnaise, salad oils, plain salad dressings made from allowed foods.  Avoid: Seeds, nuts, olives. Beverages  Allowed: All, except those listed to avoid.  Avoid: Fruit juices with high pulp, prune juice. Condiments  Allowed:Ketchup, mustard, horseradish, vinegar, cream sauce, cheese sauce, cocoa powder. Spices in moderation: allspice, basil, bay leaves, celery powder or leaves, cinnamon, cumin powder, curry powder, ginger, mace, marjoram, onion or garlic powder, oregano, paprika, parsley flakes, ground pepper, rosemary, sage, savory, tarragon, thyme, turmeric.  Avoid: Coconut, pickles. SAMPLE MENU Breakfast   cup orange juice.  1 boiled egg.  1 slice white toast.  Margarine.   cup cornflakes.  1 cup milk.  Beverage. Lunch   cup chicken noodle soup.  2 to 3 oz sliced roast beef.  2 slices white bread.  Mayonnaise.   cup tomato juice.  1 small banana.  Beverage. Dinner  3 oz baked chicken.   cup scalloped potatoes.   cup cooked beets.  White dinner roll.  Margarine.   cup canned peaches.  Beverage.  High-Fiber Diet Fiber is found in fruits, vegetables, and grains. A high-fiber diet encourages the addition of more whole grains, legumes, fruits, and vegetables in your diet. The recommended amount of fiber for adult males is 38 g per day. For adult females, it is 25 g per day. Pregnant and lactating women should get 28 g of fiber per day. If you have a digestive or bowel problem, ask your caregiver for advice before adding high-fiber foods to your  diet. Eat a variety of high-fiber foods instead of only a select few type of foods.  PURPOSE  To increase stool bulk.  To make bowel movements more regular to prevent constipation.  To lower cholesterol.  To prevent overeating. WHEN IS THIS DIET USED?  It may be used if you have constipation and hemorrhoids.  It may be used if you have uncomplicated diverticulosis (intestine condition) and irritable bowel syndrome.  It may be used if you need help with weight management.  It may be  used if you want to add it to your diet as a protective measure against atherosclerosis, diabetes, and cancer. SOURCES OF FIBER  Whole-grain breads and cereals.  Fruits, such as apples, oranges, bananas, berries, prunes, and pears.  Vegetables, such as green peas, carrots, sweet potatoes, beets, broccoli, cabbage, spinach, and artichokes.  Legumes, such split peas, soy, lentils.  Almonds. FIBER CONTENT IN FOODS Starches and Grains / Dietary Fiber (g)  Cheerios, 1 cup / 3 g  Corn Flakes cereal, 1 cup / 0.7 g  Rice crispy treat cereal, 1 cup / 0.3 g  Instant oatmeal (cooked),  cup / 2 g  Frosted wheat cereal, 1 cup / 5.1 g  Brown, long-grain rice (cooked), 1 cup / 3.5 g  White, long-grain rice (cooked), 1 cup / 0.6 g  Enriched macaroni (cooked), 1 cup / 2.5 g Legumes / Dietary Fiber (g)  Baked beans (canned, plain, or vegetarian),  cup / 5.2 g  Kidney beans (canned),  cup / 6.8 g  Pinto beans (cooked),  cup / 5.5 g Breads and Crackers / Dietary Fiber (g)  Plain or honey graham crackers, 2 squares / 0.7 g  Saltine crackers, 3 squares / 0.3 g  Plain, salted pretzels, 10 pieces / 1.8 g  Whole-wheat bread, 1 slice / 1.9 g  White bread, 1 slice / 0.7 g  Raisin bread, 1 slice / 1.2 g  Plain bagel, 3 oz / 2 g  Flour tortilla, 1 oz / 0.9 g  Corn tortilla, 1 small / 1.5 g  Hamburger or hotdog bun, 1 small / 0.9 g Fruits / Dietary Fiber (g)  Apple with skin, 1 medium /  4.4 g  Sweetened applesauce,  cup / 1.5 g  Banana,  medium / 1.5 g  Grapes, 10 grapes / 0.4 g  Orange, 1 small / 2.3 g  Raisin, 1.5 oz / 1.6 g  Melon, 1 cup / 1.4 g Vegetables / Dietary Fiber (g)  Green beans (canned),  cup / 1.3 g  Carrots (cooked),  cup / 2.3 g  Broccoli (cooked),  cup / 2.8 g  Peas (cooked),  cup / 4.4 g  Mashed potatoes,  cup / 1.6 g  Lettuce, 1 cup / 0.5 g  Corn (canned),  cup / 1.6 g  Tomato,  cup / 1.1 g

## 2013-04-25 NOTE — H&P (View-Only) (Signed)
Primary Care Physician:  Alonza Bogus, MD  Primary Gastroenterologist:  Garfield Cornea, MD   Chief Complaint  Patient presents with  . Rectal Bleeding  . Abdominal Pain    HPI:  Juan Griffith is a 48 y.o. male here frequent hematachezia. Some large volume bleeding with clots. Has had bleeding without any warning and ruined clothing. BM 2-3 is normal for him. Right now 5-6 stools, but mostly blood. Bleeding has increased over the past 2 months. Notes that he has bleeding almost 10 months per year. No constipation. Stool formed to loose. Some cramping in abdomen with this. No heartburn on Zegerid OTC 20mg  2 in the morning and one at night. Rare abdominal pain sometimes more on the left. No swallowing problems. Denies any weight loss. No family history of colon cancer or inflammatory bowel disease.  Current Outpatient Prescriptions  Medication Sig Dispense Refill  . Omeprazole-Sodium Bicarbonate (ZEGERID OTC PO) Take by mouth 3 (three) times daily.       No current facility-administered medications for this visit.    Allergies as of 04/22/2013 - Review Complete 04/22/2013  Allergen Reaction Noted  . Codeine  04/22/2013  . Sulfa antibiotics  04/22/2013    Past Medical History  Diagnosis Date  . GERD (gastroesophageal reflux disease)   . Diverticulosis   . Schatzki's ring     s/p dilation 2009    Past Surgical History  Procedure Laterality Date  . Colonoscopy  12/30/2007    [facility]:ENIDPO rectum aside from minimal anal canal hemorrhoids/Extensive left-sided diverticulum.  Colonic mucosa appeared normal  . Esophagogastroduodenoscopy  12/30/2007    [facility]:PNTIRWER ring with superimposed distal esophageal erosions/superficial ulcerations consistent with erosive/ulcerative reflux esophagitis status post dilation of Schatzki ring /small HH    Family History  Problem Relation Age of Onset  . Colon cancer Neg Hx     History   Social History  . Marital Status: Married    Spouse  Name: N/A    Number of Children: 2  . Years of Education: N/A   Occupational History  . electrician    Social History Main Topics  . Smoking status: Never Smoker   . Smokeless tobacco: Current User     Comment: dips  . Alcohol Use: Yes     Comment: every 6 months   . Drug Use: No  . Sexual Activity: Not on file   Other Topics Concern  . Not on file   Social History Narrative  . No narrative on file      ROS:  General: Negative for anorexia, weight loss, fever, chills, fatigue, weakness. Eyes: Negative for vision changes.  ENT: Negative for hoarseness, difficulty swallowing , nasal congestion. CV: Negative for chest pain, angina, palpitations, dyspnea on exertion, peripheral edema.  Respiratory: Negative for dyspnea at rest, dyspnea on exertion, cough, sputum, wheezing.  GI: See history of present illness. GU:  Negative for dysuria, hematuria, urinary incontinence, urinary frequency, nocturnal urination.  MS: Negative for joint pain, low back pain.  Derm: Negative for rash or itching.  Neuro: Negative for weakness, abnormal sensation, seizure, frequent headaches, memory loss, confusion.  Psych: Negative for anxiety, depression, suicidal ideation, hallucinations.  Endo: Negative for unusual weight change.  Heme: Negative for bruising or bleeding. Allergy: Negative for rash or hives.    Physical Examination:  BP 125/78  Pulse 71  Temp(Src) 97.6 F (36.4 C) (Oral)  Ht 6\' 1"  (1.854 m)  Wt 223 lb (101.152 kg)  BMI 29.43 kg/m2   General:  Well-nourished, well-developed in no acute distress.  Head: Normocephalic, atraumatic.   Eyes: Conjunctiva pink, no icterus. Mouth: Oropharyngeal mucosa moist and pink , no lesions erythema or exudate. Neck: Supple without thyromegaly, masses, or lymphadenopathy.  Lungs: Clear to auscultation bilaterally.  Heart: Regular rate and rhythm, no murmurs rubs or gallops.  Abdomen: Bowel sounds are normal, minimal left lower quadrant  tenderness, nondistended, no hepatosplenomegaly or masses, no abdominal bruits or    hernia , no rebound or guarding.   Rectal: Deferred Extremities: No lower extremity edema. No clubbing or deformities.  Neuro: Alert and oriented x 4 , grossly normal neurologically.  Skin: Warm and dry, no rash or jaundice.   Psych: Alert and cooperative, normal mood and affect.    

## 2013-04-25 NOTE — Interval H&P Note (Signed)
History and Physical Interval Note:  04/25/2013 12:59 PM  Juan Griffith  has presented today for surgery, with the diagnosis of RECTAL BLEED, GERD, DIVETICULER  The various methods of treatment have been discussed with the patient and family. After consideration of risks, benefits and other options for treatment, the patient has consented to  Procedure(s) with comments: COLONOSCOPY (N/A) - 1:00 as a surgical intervention .  The patient's history has been reviewed, patient examined, no change in status, stable for surgery.  I have reviewed the patient's chart and labs.  Questions were answered to the patient's satisfaction.     No change. Hemoglobin normal at 14.3. Colonoscopy per plan.The risks, benefits, limitations, alternatives and imponderables have been reviewed with the patient. Questions have been answered. All parties are agreeable.   Manus Rudd

## 2013-04-29 ENCOUNTER — Encounter (HOSPITAL_COMMUNITY): Payer: Self-pay | Admitting: Internal Medicine

## 2013-04-30 ENCOUNTER — Encounter: Payer: Self-pay | Admitting: Internal Medicine

## 2013-04-30 ENCOUNTER — Telehealth: Payer: Self-pay

## 2013-04-30 NOTE — Telephone Encounter (Signed)
Cc PCP 

## 2013-04-30 NOTE — Telephone Encounter (Signed)
Letter from: Daneil Dolin  Reason for Letter: Results Review  Send letter to patient.  Send copy of letter with path to referring provider and PCP.  Patient should be scheduled for hemorrhoid banding in a couple of weeks

## 2013-04-30 NOTE — Telephone Encounter (Signed)
Letter mailed to pt.  

## 2013-05-01 NOTE — Telephone Encounter (Signed)
Pt is aware of banding on 2/6 at 11 with RMR

## 2013-05-23 ENCOUNTER — Encounter (INDEPENDENT_AMBULATORY_CARE_PROVIDER_SITE_OTHER): Payer: Self-pay

## 2013-05-23 ENCOUNTER — Telehealth: Payer: Self-pay

## 2013-05-23 ENCOUNTER — Ambulatory Visit (INDEPENDENT_AMBULATORY_CARE_PROVIDER_SITE_OTHER): Payer: 59 | Admitting: Internal Medicine

## 2013-05-23 ENCOUNTER — Encounter: Payer: Self-pay | Admitting: Internal Medicine

## 2013-05-23 VITALS — BP 123/75 | HR 70 | Temp 98.3°F | Ht 73.0 in | Wt 219.0 lb

## 2013-05-23 DIAGNOSIS — K648 Other hemorrhoids: Secondary | ICD-10-CM

## 2013-05-23 NOTE — Progress Notes (Signed)
Patient with recent diverticulitis in a colonoscopy. Completed a course of Cipro and Flagyl. He's not having any abdominal pain at this time. Hyperplastic polyp removed. Have recommended a repeat screening colonoscopy in 10 years  Persistent of paper hematochezia progress note engorged hemorrhoids. He comes in today for his first banding session at risk, benefits, limitations reviewed.   [place] banding procedure note:    The patient presents with symptomatic grade 1 hemorrhoids.  Digital rectal rectal exam revealed a tight anal sphincter. Subsequently, I applied a pea-sized amount of nitroglycerin 0.125%. Anoscope inserted. Patient was found to have an engorged right posterior hemorrhoidal column The decision was made to band the right posterior internal hemorrhoid;  [place] was used to perform band ligation. A digital rectal is a revealed the band to be in excellent position. .  No complications were encountered and the patient tolerated the procedure well.  See discharge instructions

## 2013-05-23 NOTE — Progress Notes (Addendum)
Patient observed in our procedure room for several minutes. Still having pain which started after band placement. A repeat digital rectal exam revealed the band did not at all appear too tight. In Fact, felt that any manipulation, it would fall off . He seemed a bit more tender actually going through the anal canal which makes me think he could have a partial coexisting anal fissure as well. I reapplied a pea-sized amount of nitroglycerin ointment to this area as well as at a dose of Ana-lex.     See discharge instructions.

## 2013-05-23 NOTE — Patient Instructions (Addendum)
Avoid straining.  Benefiber 2 teaspoons twice daily  Limit toilet time to 2-3 minutes  Call with any interim problems  Schedule followup appointment in 2-3 weeks from now  Use nitroglycerin ointment-a pea-sized amount to the anorectum 3 times a day. This should be alternated with Ana- Lex  Cream applied to the anorectum 3 times a day.  You may have an anal fissure on top of hemorrhoids. These  symptoms should improve over the next 24-48 hours with the above-mentioned topical treatments.

## 2013-05-23 NOTE — Telephone Encounter (Signed)
Per Dr. Gala Romney, Rx's called to [facility] for : Nitroglycerine ointment 0.125%, apply a pea sized amount two to three times a day. Dispense 30 gm and no refills.  Also, Ana Lex Cream #1 unit, apply to anorectal area qid prn . One refill.

## 2013-06-26 ENCOUNTER — Telehealth: Payer: Self-pay | Admitting: Internal Medicine

## 2013-06-26 NOTE — Telephone Encounter (Signed)
Patient called to verify his OV for 3/17 at 8 with RMR. I told him it was a follow up from his previous banding and that RMR may or may not do another during this appointment. Patient said if RMR was going to do a banding on him it has to be on a Friday so he can have the weekend to recover. (people in back ground laughing) I told him if he insist on a Friday it will go into May 2015 before RMR has anything available. Patient said that he would still come on Tuesday and for me to put him on recall list for banding in May (on a Friday)

## 2013-06-27 NOTE — Telephone Encounter (Signed)
Noted  

## 2013-07-01 ENCOUNTER — Encounter: Payer: Self-pay | Admitting: Internal Medicine

## 2013-07-01 ENCOUNTER — Ambulatory Visit (INDEPENDENT_AMBULATORY_CARE_PROVIDER_SITE_OTHER): Payer: 59 | Admitting: Internal Medicine

## 2013-07-01 VITALS — BP 118/77 | HR 56 | Temp 98.3°F | Wt 220.2 lb

## 2013-07-01 DIAGNOSIS — K648 Other hemorrhoids: Secondary | ICD-10-CM

## 2013-07-01 DIAGNOSIS — K219 Gastro-esophageal reflux disease without esophagitis: Secondary | ICD-10-CM

## 2013-07-01 DIAGNOSIS — K5732 Diverticulitis of large intestine without perforation or abscess without bleeding: Secondary | ICD-10-CM

## 2013-07-01 NOTE — Progress Notes (Signed)
Primary Care Physician:  Alonza Bogus, MD Primary Gastroenterologist:  Dr. Gala Romney  Pre-Procedure History & Physical: HPI:  Juan Griffith is a 48 y.o. male here for here for followup after undergoing banding of the right posterior hemorrhoid column back on February 6. Patient had quite a bit of discomfort after band placement although the band was checked and felt to be in excellent position. Painful for 48 hours and subsided. He is amazed to tell me today that he hasn't had any further rectal bleeding or itching burning or discharge. He is very happy. He has fallen off on taking fiber supplementation daily. Recently treated for diverticulitis. He feels great. Reflux symptoms well controlled on Prilosec 20 mg twice daily. No dysphagia. Past Medical History  Diagnosis Date  . GERD (gastroesophageal reflux disease)   . Diverticulosis   . Schatzki's ring     s/p dilation 2009  . Sigmoid diverticulitis   . Hemorrhoids   . Hyperplastic colon polyp     Past Surgical History  Procedure Laterality Date  . Colonoscopy  12/30/2007    [id number] rectum aside from minimal anal canal hemorrhoids/Extensive left-sided diverticulum.  Colonic mucosa appeared normal  . Esophagogastroduodenoscopy  12/30/2007    [id number] ring with superimposed distal esophageal erosions/superficial ulcerations consistent with erosive/ulcerative reflux esophagitis status post dilation of Schatzki ring /small HH  . Colonoscopy N/A 04/25/2013    Dr. Gala Romney- engorged internal hemorrhoids- likely source of hematochezia. diminutive rectosigmoid polyp. L sided diverticulosis and sigmoid diverticulitis, hyperplastic polyp  . Hemorrhoid banding      Prior to Admission medications   Medication Sig Start Date End Date Taking? Authorizing Provider  acetaminophen (TYLENOL) 500 MG tablet Take 500 mg by mouth every 6 (six) hours as needed.   Yes Historical Provider, MD  omeprazole (PRILOSEC OTC) 20 MG tablet Take 1 tablet (20 mg  total) by mouth daily. 04/22/13  Yes Mahala Menghini, PA-C    Allergies as of 07/01/2013 - Review Complete 05/23/2013  Allergen Reaction Noted  . Codeine  04/22/2013  . Sulfa antibiotics  04/22/2013    Family History  Problem Relation Age of Onset  . Colon cancer Neg Hx   . Inflammatory bowel disease Neg Hx     History   Social History  . Marital Status: Married    Spouse Name: N/A    Number of Children: 2  . Years of Education: N/A   Occupational History  . electrician    Social History Main Topics  . Smoking status: Never Smoker   . Smokeless tobacco: Current User     Comment: dips  . Alcohol Use: Yes     Comment: every 6 months   . Drug Use: No  . Sexual Activity: Not on file   Other Topics Concern  . Not on file   Social History Narrative  . No narrative on file    Review of Systems: See HPI, otherwise negative ROS  Physical Exam: BP 118/77  Pulse 56  Temp(Src) 98.3 F (36.8 C) (Oral)  Wt 220 lb 3.2 oz (99.882 kg) General:   Alert,  Well-developed, well-nourished, pleasant and cooperative in NAD Skin:  Intact without significant lesions or rashes. Eyes:  Sclera clear, no icterus.   Conjunctiva pink. Ears:  Normal auditory acuity. Nose:  No deformity, discharge,  or lesions. Mouth:  No deformity or lesions. Neck:  Supple; no masses or thyromegaly. No significant cervical adenopathy. Lungs:  Clear throughout to auscultation.   No wheezes, crackles, or rhonchi.  No acute distress. Heart:  Regular rate and rhythm; no murmurs, clicks, rubs,  or gallops. Abdomen: Non-distended, normal bowel sounds.  Soft and nontender without appreciable mass or hepatosplenomegaly.  Pulses:  Normal pulses noted. Extremities:  Without clubbing or edema.  Impression/Plan: Status post 1 band placement on a symptomatic hemorrhoid. I wonder if he hasn't had an element of an associated anal fissure - occult. Banding has been associated with a dramatic resolution of his symptoms.  Since he currently does not have any further hemorrhoid symptoms, no further band placement is indicated. GERD symptoms well controlled on twice a day Prilosec. No dysphagia. No evidence of recurrent diverticulitis.  Recommendations:  Avoid straining.  Benefiber 2 teaspoons twice daily  Limit toilet time to 2-3 minutes  Continue prilosec 20 mg twice daily  Call with any interim problems  Call if any recent bleeding  Office visit in 1year

## 2013-07-01 NOTE — Patient Instructions (Addendum)
Avoid straining.  Benefiber 2 teaspoons twice daily  Limit toilet time to 2-3 minutes  Continue prilosec 20 mg twice daily  Call with any interim problems  Call if any recent bleeding  Office visit in 1year

## 2014-06-18 ENCOUNTER — Encounter: Payer: Self-pay | Admitting: Internal Medicine

## 2014-08-31 ENCOUNTER — Other Ambulatory Visit: Payer: Self-pay | Admitting: Neurosurgery

## 2014-08-31 DIAGNOSIS — M5442 Lumbago with sciatica, left side: Secondary | ICD-10-CM

## 2014-09-13 ENCOUNTER — Ambulatory Visit
Admission: RE | Admit: 2014-09-13 | Discharge: 2014-09-13 | Disposition: A | Discharge status: Home / Self Care | Payer: Self-pay | Source: Ambulatory Visit | Attending: Neurosurgery | Admitting: Neurosurgery

## 2014-09-13 DIAGNOSIS — M5442 Lumbago with sciatica, left side: Secondary | ICD-10-CM

## 2015-12-07 ENCOUNTER — Encounter: Payer: Self-pay | Admitting: Internal Medicine

## 2015-12-07 ENCOUNTER — Ambulatory Visit (INDEPENDENT_AMBULATORY_CARE_PROVIDER_SITE_OTHER): Payer: 59 | Admitting: Internal Medicine

## 2015-12-07 VITALS — BP 120/72 | HR 80 | Temp 97.2°F | Ht 73.0 in | Wt 221.4 lb

## 2015-12-07 DIAGNOSIS — K648 Other hemorrhoids: Secondary | ICD-10-CM | POA: Diagnosis not present

## 2015-12-07 NOTE — Progress Notes (Signed)
[place] banding procedure note:  The patient presents with symptomatic grade 2/3 hemorrhoids; history of known engorged hemorrhoids 2015 colonoscopy. Status post banding of the right posterior column x1 in 2015. Likely, had an associated anal fissure at that time. Ultimately, only 1 band placed was treated topically nitroglycerin for anal canal fissure. He returned, stating he did very well and was not have any further symptoms.  However,  for the past couple weeks he's had blood her rectum with stooling-bright red with some clots.. Denies constipation or diarrhea. No associated perianal/ rectal pain , no abdominalno  pain. No unusual straining; he stopped taking fiber supplementation.  In the left lateral decubitus position, a DRE revealed a somewhat tight anal canal and a single protruding external hemorrhoid tag. The anal canal was not particularly tender. No masses appreciated.  Subsequently, anoscopy performed which revealed an engorged right anterior and right posterior hemorrhoid column no other abnormalities observed. I have offered patient a repeat banding at this time. Risks, benefits, limitations alternatives have been reviewed. No 100% guarantee could be issued that all of his hemorrhoids symptoms would be taking care with this modality. He is interested in proceeding  A pea-sized amount of nitroglycerin 0.125% and 2% Xylocaine was instilled in the anal canal. Subsequently, the [place] a banding device was aimed towards the right anterior hemorrhoid. The band was deployed. A follow-up inspection digitally demonstrated band barely engaged; There was not much tissue gathered. Patient experienced no pinching or pain; subsequently, I placed another band adjacent to it;  this deployment en-snared a more appropriate amount of hemorrhoid tissue.  Again, no pinching or pain. Patient tolerated procedure well.  No complications associated with the procedure. Please see discharge instructions.

## 2015-12-07 NOTE — Patient Instructions (Signed)
Avoid straining with BM and with heavy lifting.  Benefiber 2 teaspoons twice daily  Limit toilet time to 2-5 minutes  Call with any interim problems  Schedule followup appointment in 3-4 weeks from now

## 2016-01-04 ENCOUNTER — Ambulatory Visit (INDEPENDENT_AMBULATORY_CARE_PROVIDER_SITE_OTHER): Payer: 59 | Admitting: Internal Medicine

## 2016-01-04 ENCOUNTER — Encounter: Payer: Self-pay | Admitting: Internal Medicine

## 2016-01-04 VITALS — BP 131/69 | HR 78 | Temp 97.0°F | Ht 73.0 in | Wt 224.8 lb

## 2016-01-04 DIAGNOSIS — K648 Other hemorrhoids: Secondary | ICD-10-CM | POA: Diagnosis not present

## 2016-01-04 DIAGNOSIS — K219 Gastro-esophageal reflux disease without esophagitis: Secondary | ICD-10-CM

## 2016-01-04 NOTE — Patient Instructions (Signed)
Avoid straining.  Daily fiber supplement like Metamucil caplets or wafers recommended  Limit toilet time to 2-5 minutes  Call with any interim problems  Schedule followup appointment in 4 weeks from now

## 2016-01-04 NOTE — Progress Notes (Signed)
[place] banding procedure note:  The patient presents with symptomatic grade 2 hemorrhoids,'  Seen in 2015 for rectal bleeding and diverticulitis but had stigmata diverticulitis a colonoscopy and engorged hemorrhoids. Status post banding of the right posterior and more recently right anterior column. May have had a coexisting anal fissure. States rectal bleeding has diminished but has not stopped. He has a has had no abdominal pain. He takes fiber supplementation only sporadically. Last Colonoscopy report reviewed.  About the further banding would be appropriate to be in the left lateral hemorrhoid column today.al therapy, All risks, benefits, and alternative forms of therapy were described and informed consent was obtained.  In the left lateral decubitus position, GERD revealed only somewhat tight anal sphincter tone. No mass. No external lesions.  Pea-sized amount of nitroglycerin 0.125% and 2% Xylocaine cream was instilled in the anorectum. 5 minutes later follow-up digital rectal exam revealed subluxation sphincter tone.  The decision was made to band the left lateral internal hemorrhoid; the [place] was used to perform band ligation without complication. Digital anorectal examination was then performed to assure proper positioning of the band;  Been found to be in excellent position. No pinching or pain. The patient was discharged home without pain or other issues. Dietary and behavioral recommendations were given.  As a separate issue, reflux symptoms well controlled on omeprazole 20 mg daily to twice daily. No recurrent dysphagia since he had his esophagus stretched.  He is to continue omeprazole daily.    No complications were encountered and the patient tolerated the procedure well.

## 2016-02-01 ENCOUNTER — Encounter: Payer: Self-pay | Admitting: Internal Medicine

## 2016-02-01 ENCOUNTER — Ambulatory Visit (INDEPENDENT_AMBULATORY_CARE_PROVIDER_SITE_OTHER): Payer: 59 | Admitting: Internal Medicine

## 2016-02-01 VITALS — BP 117/72 | HR 65 | Temp 97.2°F | Ht 75.0 in | Wt 223.6 lb

## 2016-02-01 DIAGNOSIS — K648 Other hemorrhoids: Secondary | ICD-10-CM

## 2016-02-01 DIAGNOSIS — K219 Gastro-esophageal reflux disease without esophagitis: Secondary | ICD-10-CM

## 2016-02-01 NOTE — Progress Notes (Signed)
Continue Omeprazole 20 mg twice daily  Continue Benefiber 1 tablespoon twice daily  Office visit with [place] in 1 year

## 2016-02-01 NOTE — Patient Instructions (Signed)
GERD information provided  Continue omeprazole 20 mg twice daily  Continue Benefiber 1 tablespoon twice daily  Repeat colonoscopy in 8 years  Office visit with [place] in one year.

## 2016-02-01 NOTE — Progress Notes (Signed)
Primary Care Physician:  Alonza Bogus, MD Primary Gastroenterologist:  Dr. Gala Romney  Pre-Procedure History & Physical: HPI:  Juan Griffith is a 50 y.o. male here for for follow-up  -  status post banding of hemorrhoids. Juan Griffith is happy that his bleeding and other hemorrhoids symptoms have resolved. He's taking fiber everyday in the way of Benefiber. Reflux symptoms well controlled on omeprazole 20 mg twice daily. He is due for average risk screening colonoscopy in 8 years from now.  Past Medical History:  Diagnosis Date  . Diverticulosis   . GERD (gastroesophageal reflux disease)   . Hemorrhoids   . Hyperplastic colon polyp   . Schatzki's ring    s/p dilation 2009  . Sigmoid diverticulitis     Past Surgical History:  Procedure Laterality Date  . COLONOSCOPY  12/30/2007   [id number] rectum aside from minimal anal canal hemorrhoids/Extensive left-sided diverticulum.  Colonic mucosa appeared normal  . COLONOSCOPY N/A 04/25/2013   Dr. Gala Romney- engorged internal hemorrhoids- likely source of hematochezia. diminutive rectosigmoid polyp. L sided diverticulosis and sigmoid diverticulitis, hyperplastic polyp  . ESOPHAGOGASTRODUODENOSCOPY  12/30/2007   [id number] ring with superimposed distal esophageal erosions/superficial ulcerations consistent with erosive/ulcerative reflux esophagitis status post dilation of Schatzki ring /small HH  . HEMORRHOID BANDING      Prior to Admission medications   Medication Sig Start Date End Date Taking? Authorizing Provider  acetaminophen (TYLENOL) 500 MG tablet Take 500 mg by mouth every 6 (six) hours as needed.   Yes Historical Provider, MD  omeprazole (PRILOSEC OTC) 20 MG tablet Take 1 tablet (20 mg total) by mouth daily. 04/22/13  Yes Mahala Menghini, PA-C    Allergies as of 02/01/2016 - Review Complete 02/01/2016  Allergen Reaction Noted  . Codeine  04/22/2013  . Sulfa antibiotics  04/22/2013    Family History  Problem Relation Age of Onset   . Colon cancer Neg Hx   . Inflammatory bowel disease Neg Hx     Social History   Social History  . Marital status: Married    Spouse name: N/A  . Number of children: 2  . Years of education: N/A   Occupational History  . electrician    Social History Main Topics  . Smoking status: Never Smoker  . Smokeless tobacco: Current User     Comment: dips  . Alcohol use Yes     Comment: every 6 months   . Drug use: No  . Sexual activity: Not on file   Other Topics Concern  . Not on file   Social History Narrative  . No narrative on file    Review of Systems: See HPI, otherwise negative ROS  Physical Exam: BP 117/72   Pulse 65   Temp 97.2 F (36.2 C) (Oral)   Ht 6\' 3"  (1.905 m)   Wt 223 lb 9.6 oz (101.4 kg)   BMI 27.95 kg/m  General:   Alert,  Well-developed, well-nourished, pleasant and cooperative in NAD    Impression:  Pleasant 50 year old gentleman with symptomatic hemorrhoids now much better after hemorrhoid banding. I told him he could take his fiber supplement daily indefinitely and avoid extremes in bowel option. He should avoid straining. GERD symptoms well controlled on omeprazole 20 mg twice daily. If he misses a single dose he has a flare.  Recommendations:  GERD information provided  Continue omeprazole 20 mg twice daily  Continue Benefiber 1 tablespoon twice daily  Repeat colonoscopy in 8 years  Office visit with  [place] in one year.      Notice: This dictation was prepared with Dragon dictation along with smaller phrase technology. Any transcriptional errors that result from this process are unintentional and may not be corrected upon review.

## 2016-04-20 ENCOUNTER — Telehealth: Payer: Self-pay

## 2016-04-20 NOTE — Telephone Encounter (Signed)
Tried to call again- NA-no voicemail.

## 2016-04-20 NOTE — Telephone Encounter (Signed)
Pt called- he said he is seeing a lot of blood in the water after a bm. He said it was just as bad as it was before he had his bandings done. No pain, no swelling, no itching, just a lot of blood and it is worrying him. He wants to come back for an office visit and let you look at his hemorrhoids again but he wants to know if there is anything he can put on it now to help with the bleeding until he can come in?

## 2016-04-20 NOTE — Telephone Encounter (Signed)
Per RMR- pt can have samples of prep H wipes. He should also be taking fiber everyday and a stool softener. Pt can try sitz baths over the weekend and call us on Monday and let us know how he is doing. I have the samples at the front desk for him. I tried to call- NA and no voicemail.

## 2016-04-20 NOTE — Telephone Encounter (Signed)
I have sent him a message via mychart.

## 2016-04-21 NOTE — Telephone Encounter (Signed)
I talked to the pt and he is aware. He is going to come by and pick up the samples.

## 2016-06-05 ENCOUNTER — Other Ambulatory Visit: Payer: Self-pay | Admitting: Orthopedic Surgery

## 2016-06-05 DIAGNOSIS — M25511 Pain in right shoulder: Secondary | ICD-10-CM

## 2016-06-11 ENCOUNTER — Ambulatory Visit
Admission: RE | Admit: 2016-06-11 | Discharge: 2016-06-11 | Disposition: A | Discharge status: Home / Self Care | Payer: 59 | Source: Ambulatory Visit | Attending: Orthopedic Surgery | Admitting: Orthopedic Surgery

## 2016-06-11 DIAGNOSIS — M25511 Pain in right shoulder: Secondary | ICD-10-CM

## 2016-12-28 ENCOUNTER — Encounter: Payer: Self-pay | Admitting: Internal Medicine

## 2017-02-12 ENCOUNTER — Other Ambulatory Visit: Payer: Self-pay | Admitting: Orthopedic Surgery

## 2017-02-12 DIAGNOSIS — M25511 Pain in right shoulder: Secondary | ICD-10-CM

## 2017-03-03 ENCOUNTER — Ambulatory Visit
Admission: RE | Admit: 2017-03-03 | Discharge: 2017-03-03 | Disposition: A | Discharge status: Home / Self Care | Payer: 59 | Source: Ambulatory Visit | Attending: Orthopedic Surgery | Admitting: Orthopedic Surgery

## 2017-03-03 DIAGNOSIS — M25511 Pain in right shoulder: Secondary | ICD-10-CM

## 2018-12-06 IMAGING — MR MR SHOULDER*R* W/O CM
4 of 5 series · 22 of 40 positions shown · non-contrast
Comparison: Right shoulder MRI dated June 11, 2016.

CLINICAL DATA: Persistent right shoulder pain and popping sensation
after rotator cuff surgery in June 2016.

EXAM:
MRI OF THE RIGHT SHOULDER WITHOUT CONTRAST
TECHNIQUE: Multiplanar, multisequence MR imaging of the shoulder was performed.
No intravenous contrast was administered.

[Series 4: T2 fat-sat · axial · 4.0mm · 0.55mm/px · z∈[-38,+46]mm · 7 of 23 slices shown (1 of 3)]
[im 1/23]
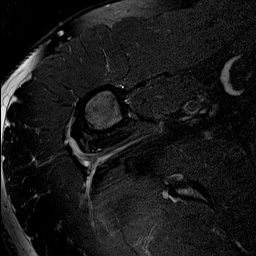
[im 4/23]
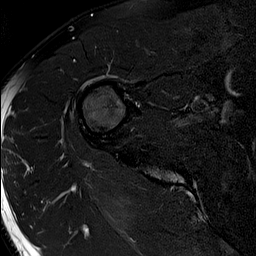
[im 7/23]
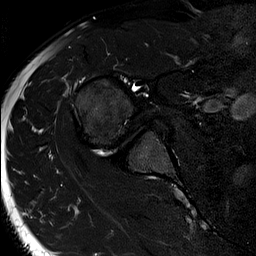
[im 10/23]
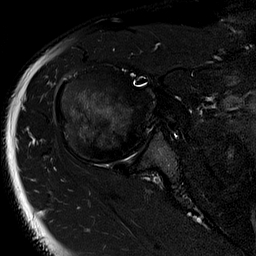
[im 13/23]
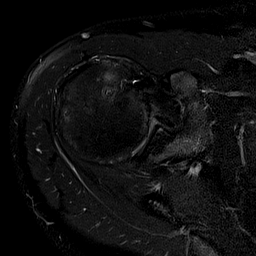
[im 16/23]
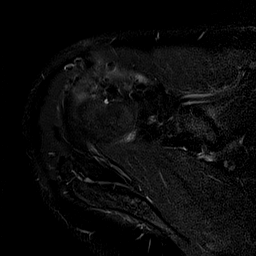
[im 19/23]
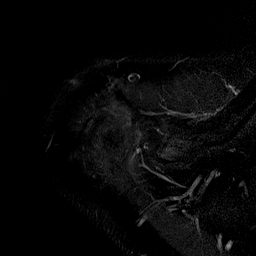

[Series 5: T2 fat-sat · oblique · 4.0mm · 0.55mm/px · 3 of 22 slices shown (2 of 3)]
[im 4/22]
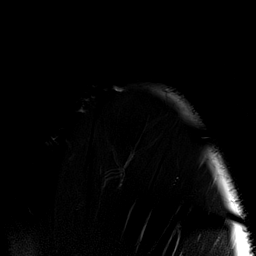
[im 11/22]
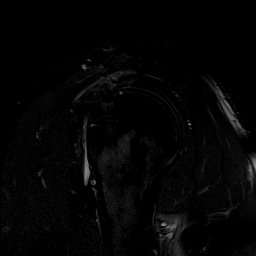
[im 18/22]
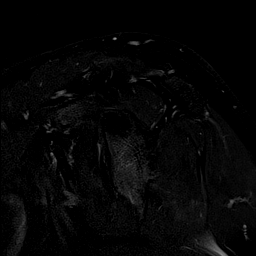

[Series 7: T2 fat-sat · oblique · 4.0mm · 0.27mm/px · 3 of 27 slices shown (3 of 3)]
[im 4/27]
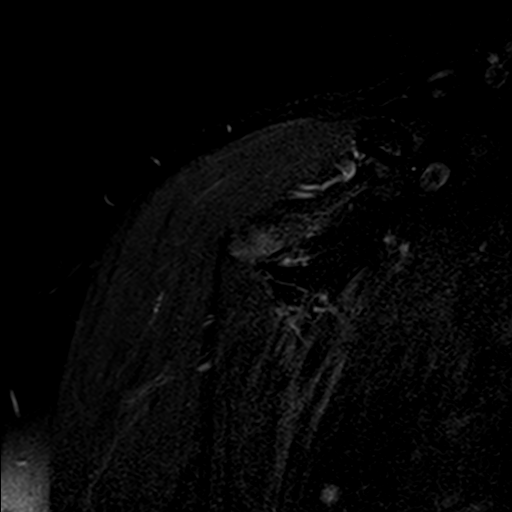
[im 14/27]
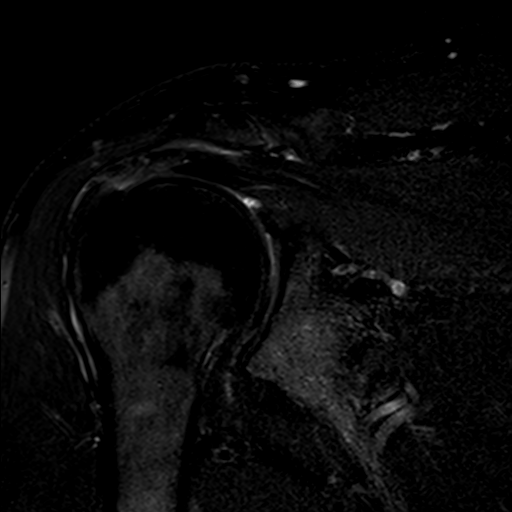
[im 23/27]
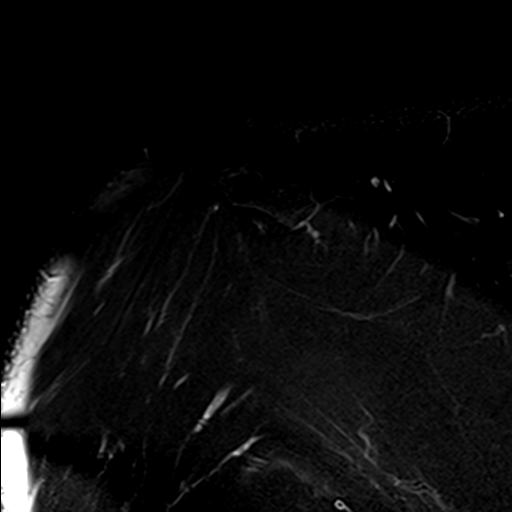

[Series 8: PD · oblique · 4.0mm · 0.22mm/px · 9 of 27 slices shown]
[im 1/27]
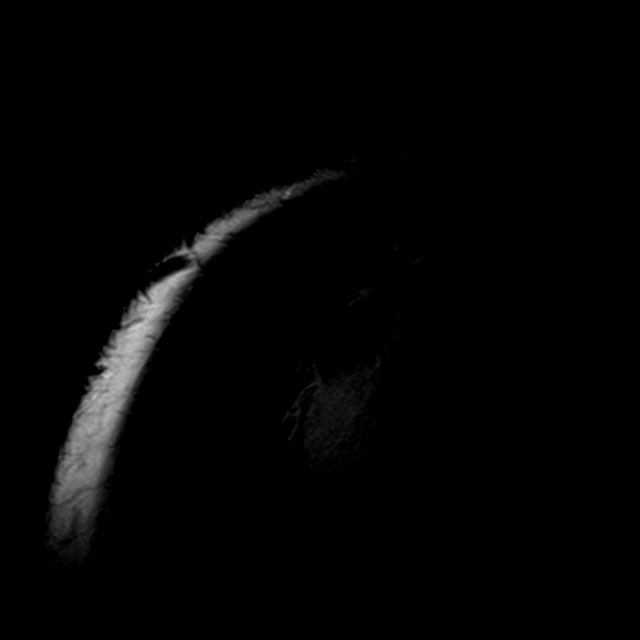
[im 4/27]
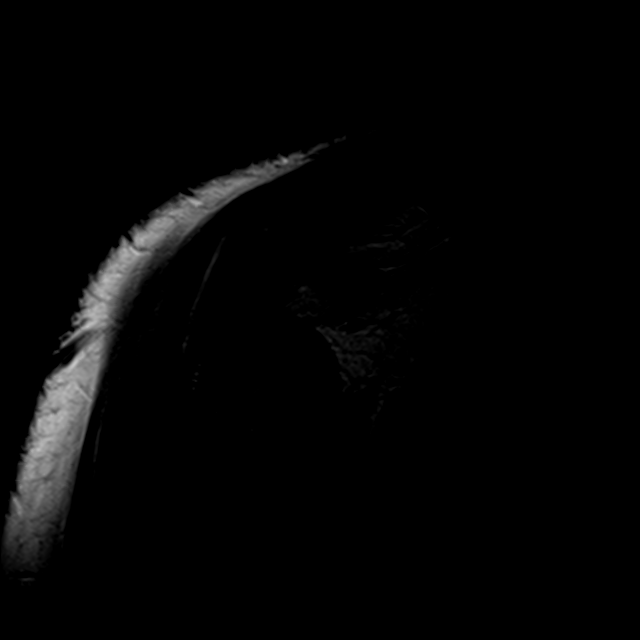
[im 7/27]
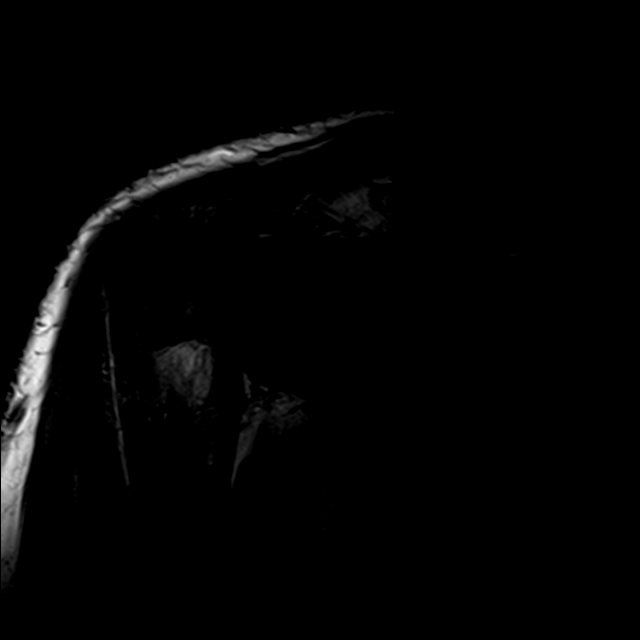
[im 10/27]
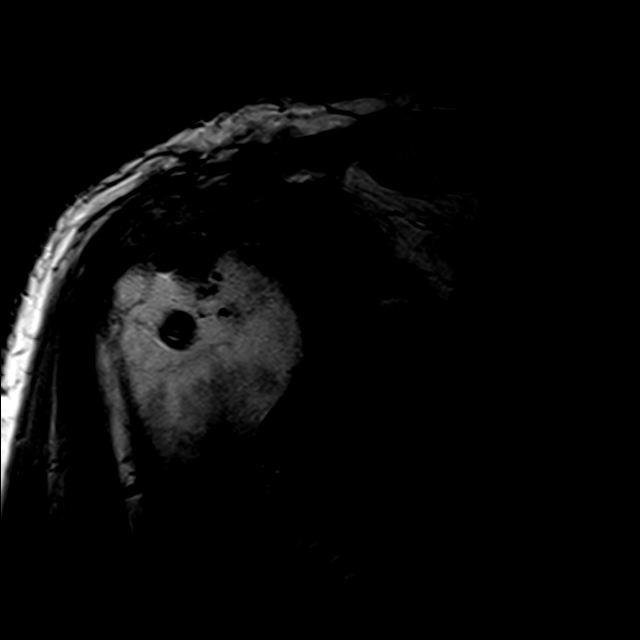
[im 14/27]
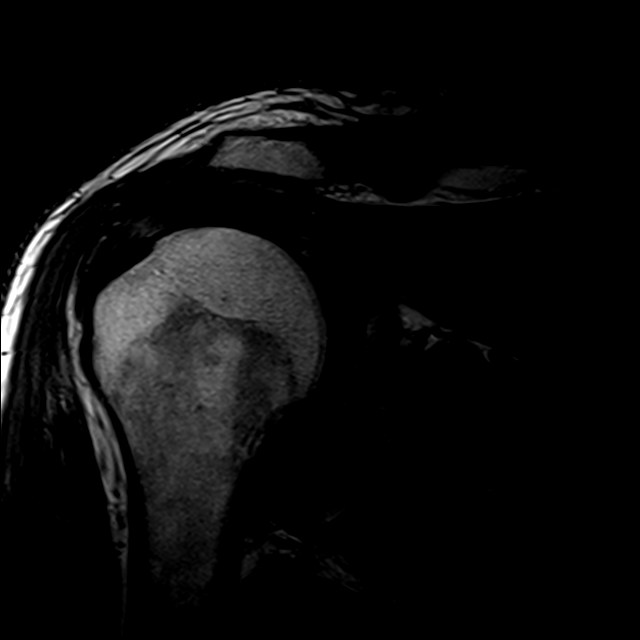
[im 17/27]
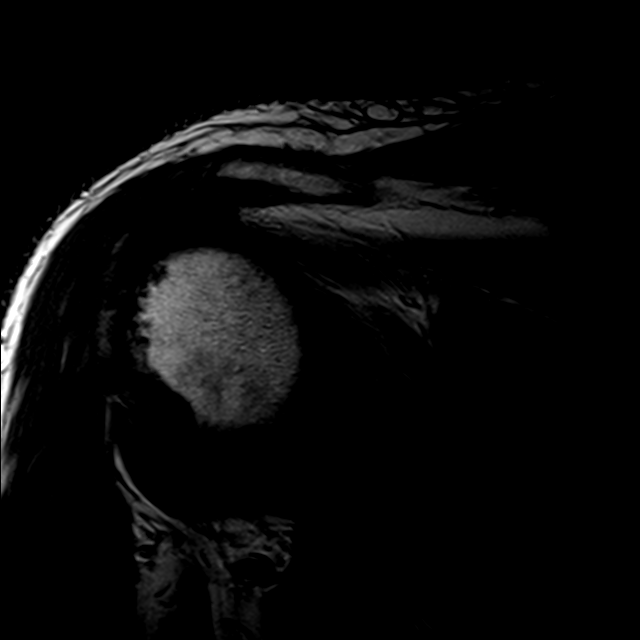
[im 20/27]
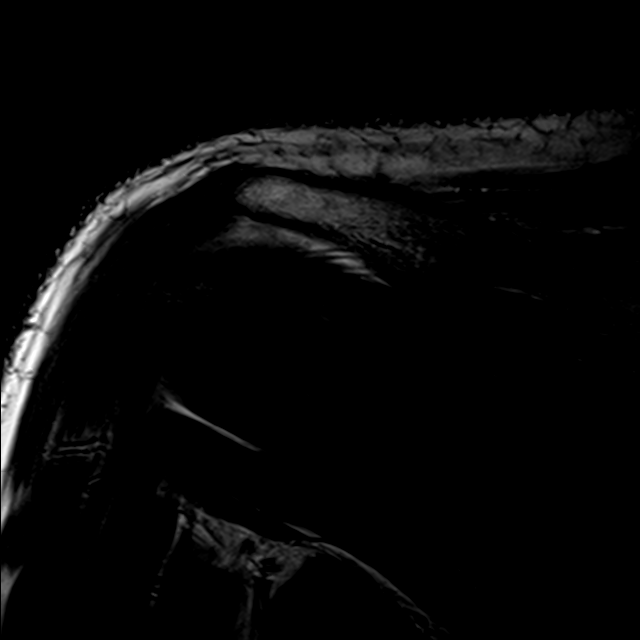
[im 23/27]
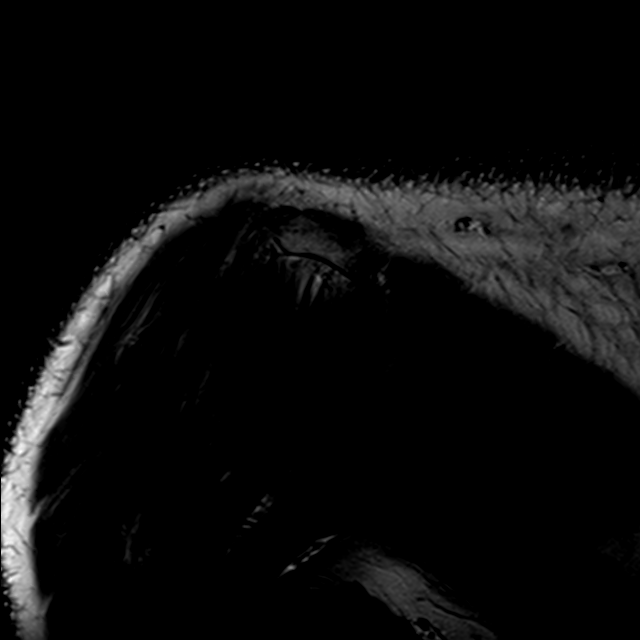
[im 27/27]
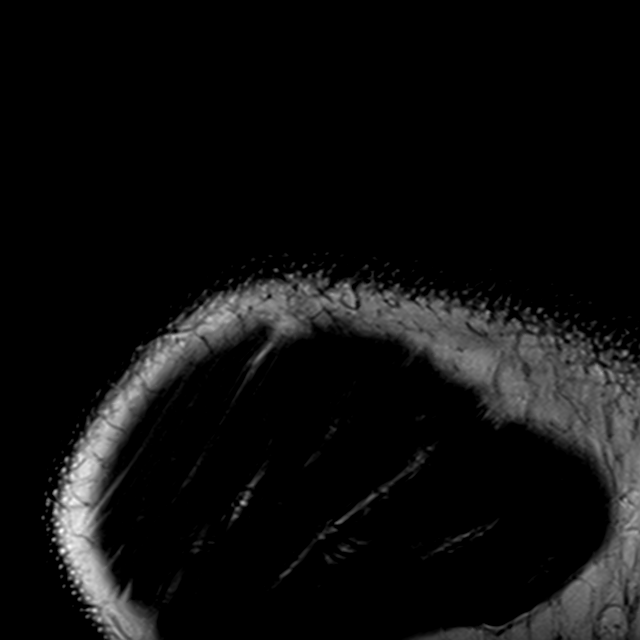

[22 of 40 positions shown; findings below may reference images not displayed]

FINDINGS: Rotator cuff: Postsurgical changes related to prior supraspinatus
tendon repair with suture anchors in the anterior aspect of the
greater tuberosity. No evidence of recurrent tear. Mild
supraspinatus, infraspinatus, and subscapularis tendinosis.

Muscles: No atrophy or abnormal signal of the muscles of the rotator
cuff.

Biceps long head:  Intact.  Mild tenosynovitis.

Acromioclavicular Joint: Interval distal clavicle resection. Type II
acromion. Small subacromial/subdeltoid bursal fluid.

Glenohumeral Joint: No joint effusion. Moderate degenerative
changes.

Labrum: Probable sublabral foramen in the anterior superior labrum.
Linear signal undercutting the posterior labrum, concerning for
tear. The superior labrum appears intact.

Bones:  No marrow abnormality, fracture or dislocation.

Other: None.
IMPRESSION: 1. Prior supraspinatus tendon repair. No recurrent rotator cuff
tendon tear. Mild rotator cuff tendinosis.
2. Mild biceps tenosynovitis.
3. Mild acromioclavicular and moderate glenohumeral degenerative
changes.
4. Mild subacromial/subdeltoid bursitis.
5. Possible posterior labral tear.

## 2018-12-12 ENCOUNTER — Emergency Department (HOSPITAL_COMMUNITY)
Admission: EM | Admit: 2018-12-12 | Discharge: 2018-12-12 | Disposition: A | Discharge status: Home / Self Care | Payer: 59 | Attending: Emergency Medicine | Admitting: Emergency Medicine

## 2018-12-12 ENCOUNTER — Emergency Department (HOSPITAL_COMMUNITY): Payer: 59

## 2018-12-12 ENCOUNTER — Other Ambulatory Visit: Payer: Self-pay | Admitting: Orthopedic Surgery

## 2018-12-12 ENCOUNTER — Encounter (HOSPITAL_COMMUNITY): Payer: Self-pay | Admitting: Emergency Medicine

## 2018-12-12 ENCOUNTER — Other Ambulatory Visit: Payer: Self-pay

## 2018-12-12 DIAGNOSIS — Z79899 Other long term (current) drug therapy: Secondary | ICD-10-CM | POA: Diagnosis not present

## 2018-12-12 DIAGNOSIS — F17290 Nicotine dependence, other tobacco product, uncomplicated: Secondary | ICD-10-CM | POA: Diagnosis not present

## 2018-12-12 DIAGNOSIS — M5030 Other cervical disc degeneration, unspecified cervical region: Secondary | ICD-10-CM

## 2018-12-12 DIAGNOSIS — N201 Calculus of ureter: Secondary | ICD-10-CM | POA: Insufficient documentation

## 2018-12-12 DIAGNOSIS — R109 Unspecified abdominal pain: Secondary | ICD-10-CM | POA: Diagnosis present

## 2018-12-12 DIAGNOSIS — M25511 Pain in right shoulder: Secondary | ICD-10-CM

## 2018-12-12 LAB — URINALYSIS, ROUTINE W REFLEX MICROSCOPIC
Bilirubin Urine: NEGATIVE
Glucose, UA: NEGATIVE mg/dL
Ketones, ur: NEGATIVE mg/dL
Leukocytes,Ua: NEGATIVE
Nitrite: NEGATIVE
Protein, ur: 30 mg/dL — AB
RBC / HPF: >50 RBC/hpf — ABNORMAL HIGH (ref 0–5)
Specific Gravity, Urine: 1.021 (ref 1.005–1.030)
pH: 5 (ref 5.0–8.0)

## 2018-12-12 LAB — CBC WITH DIFFERENTIAL/PLATELET
Abs Immature Granulocytes: 0.05 10*3/uL (ref 0.00–0.07)
Basophils Absolute: 0.1 10*3/uL (ref 0.0–0.1)
Basophils Relative: 1 %
Eosinophils Absolute: 0.3 10*3/uL (ref 0.0–0.5)
Eosinophils Relative: 3 %
HCT: 43.1 % (ref 39.0–52.0)
Hemoglobin: 13.8 g/dL (ref 13.0–17.0)
Immature Granulocytes: 1 %
Lymphocytes Relative: 28 %
Lymphs Abs: 2.5 10*3/uL (ref 0.7–4.0)
MCH: 27.2 pg (ref 26.0–34.0)
MCHC: 32 g/dL (ref 30.0–36.0)
MCV: 85 fL (ref 80.0–100.0)
Monocytes Absolute: 0.9 10*3/uL (ref 0.1–1.0)
Monocytes Relative: 10 %
Neutro Abs: 5.3 10*3/uL (ref 1.7–7.7)
Neutrophils Relative %: 57 %
Platelets: 327 10*3/uL (ref 150–400)
RBC: 5.07 MIL/uL (ref 4.22–5.81)
RDW: 13.9 % (ref 11.5–15.5)
WBC: 9.1 10*3/uL (ref 4.0–10.5)
nRBC: 0 % (ref 0.0–0.2)

## 2018-12-12 LAB — COMPREHENSIVE METABOLIC PANEL
ALT: 36 U/L (ref 0–44)
AST: 21 U/L (ref 15–41)
Albumin: 4.7 g/dL (ref 3.5–5.0)
Alkaline Phosphatase: 56 U/L (ref 38–126)
Anion gap: 12 (ref 5–15)
BUN: 15 mg/dL (ref 6–20)
CO2: 23 mmol/L (ref 22–32)
Calcium: 9.5 mg/dL (ref 8.9–10.3)
Chloride: 104 mmol/L (ref 98–111)
Creatinine, Ser: 1.17 mg/dL (ref 0.61–1.24)
GFR calc Af Amer: >60 mL/min (ref >60)
GFR calc non Af Amer: >60 mL/min (ref >60)
Glucose, Bld: 149 mg/dL — ABNORMAL HIGH (ref 70–99)
Potassium: 4.2 mmol/L (ref 3.5–5.1)
Sodium: 139 mmol/L (ref 135–145)
Total Bilirubin: 0.7 mg/dL (ref 0.3–1.2)
Total Protein: 8.3 g/dL — ABNORMAL HIGH (ref 6.5–8.1)

## 2018-12-12 MED ORDER — ONDANSETRON 4 MG PO TBDP: 4.0000 mg | ORAL_TABLET | Freq: Three times a day (TID) | ORAL | 0 refills | Status: DC | PRN

## 2018-12-12 MED ORDER — ONDANSETRON HCL 4 MG/2ML IJ SOLN
4.0000 mg | Freq: Once | INTRAMUSCULAR | Status: AC
  Administered 2018-12-12: 15:00:00 4 mg via INTRAVENOUS
  Filled 2018-12-12: qty 2

## 2018-12-12 MED ORDER — HYDROMORPHONE HCL 1 MG/ML IJ SOLN
1.0000 mg | Freq: Once | INTRAMUSCULAR | Status: AC
  Administered 2018-12-12: 15:00:00 1 mg via INTRAVENOUS
  Filled 2018-12-12: qty 1

## 2018-12-12 MED ORDER — KETOROLAC TROMETHAMINE 30 MG/ML IJ SOLN
30.0000 mg | Freq: Once | INTRAMUSCULAR | Status: AC
  Administered 2018-12-12: 16:00:00 30 mg via INTRAVENOUS
  Filled 2018-12-12: qty 1

## 2018-12-12 MED ORDER — OXYCODONE-ACETAMINOPHEN 5-325 MG PO TABS: 1.0000 | ORAL_TABLET | Freq: Three times a day (TID) | ORAL | 0 refills | Status: DC | PRN

## 2018-12-12 NOTE — ED Triage Notes (Signed)
Patient works night shift, reports being awakened by sharp R flank pain at 1300 today. Denies urinary symptoms or hematuria. Patient c/o nausea.

## 2018-12-12 NOTE — Discharge Instructions (Addendum)
Take the pain medication as needed. If your symptoms do not improve in 4 to 5 days, contact the urologist listed below. Return to the ED if you start to develop a fever or symptoms such as worsening pain, uncontrolled vomiting, blood in your stool.

## 2018-12-12 NOTE — ED Provider Notes (Signed)
[facility] [facility] Provider Note   CSN: [id number] Arrival date & time: 12/12/18  1411     History   Chief Complaint Chief Complaint  Patient presents with   Flank Pain    HPI Juan Griffith is a 53 y.o. male with a past medical history of GERD, diverticulosis who presents to ED for acute onset of right flank pain that began approximately 2 hours prior to arrival.  Reports constant, sharp pain rated at 10 out of 10 with associated dry heaving.  He denies any urinary symptoms, vomiting or changes to bowel movements.  States that the pain woke him up from his sleep.  He has a history of kidney stones in the past but cannot recall if this feels similar.  He denies any fever, but does note cold sweats.  Denies prior abdominal surgeries.  He has not tried any over-the-counter medications to help with the symptoms.     HPI  Past Medical History:  Diagnosis Date   Diverticulosis    GERD (gastroesophageal reflux disease)    Hemorrhoids    Hyperplastic colon polyp    Schatzki's ring    s/p dilation 2009   Sigmoid diverticulitis     Patient Active Problem List   Diagnosis Date Noted   Rectal bleeding 04/22/2013   GERD (gastroesophageal reflux disease) 04/22/2013    Past Surgical History:  Procedure Laterality Date   COLONOSCOPY  12/30/2007   [id number] rectum aside from minimal anal canal hemorrhoids/Extensive left-sided diverticulum.  Colonic mucosa appeared normal   COLONOSCOPY N/A 04/25/2013   Dr. Gala Romney- engorged internal hemorrhoids- likely source of hematochezia. diminutive rectosigmoid polyp. L sided diverticulosis and sigmoid diverticulitis, hyperplastic polyp   ESOPHAGOGASTRODUODENOSCOPY  12/30/2007   [id number] ring with superimposed distal esophageal erosions/superficial ulcerations consistent with erosive/ulcerative reflux esophagitis status post dilation of Schatzki ring /small HH   HEMORRHOID BANDING          Home Medications     Prior to Admission medications   Medication Sig Start Date End Date Taking? Authorizing Provider  acetaminophen (TYLENOL) 500 MG tablet Take 500 mg by mouth every 6 (six) hours as needed.    [provider]  methocarbamol (ROBAXIN) 500 MG tablet  12/06/18   [provider]  omeprazole (PRILOSEC OTC) 20 MG tablet Take 1 tablet (20 mg total) by mouth daily. 04/22/13   Mahala Menghini, PA-C  ondansetron (ZOFRAN ODT) 4 MG disintegrating tablet Take 1 tablet (4 mg total) by mouth every 8 (eight) hours as needed for nausea or vomiting. 12/12/18   Shahmeer Bunn, PA-C  oxyCODONE-acetaminophen (PERCOCET/ROXICET) 5-325 MG tablet Take 1 tablet by mouth every 8 (eight) hours as needed for severe pain. 12/12/18   Delia Heady, PA-C    Family History Family History  Problem Relation Age of Onset   Colon cancer Neg Hx    Inflammatory bowel disease Neg Hx     Social History Social History   Tobacco Use   Smoking status: Never Smoker   Smokeless tobacco: Current User   Tobacco comment: dips  Substance Use Topics   Alcohol use: Not Currently    Comment: every 6 months    Drug use: No     Allergies   Codeine and Sulfa antibiotics   Review of Systems Review of Systems  Constitutional: Negative for appetite change, chills and fever.  HENT: Negative for ear pain, rhinorrhea, sneezing and sore throat.   Eyes: Negative for photophobia and visual disturbance.  Respiratory: Negative for  cough, chest tightness, shortness of breath and wheezing.   Cardiovascular: Negative for chest pain and palpitations.  Gastrointestinal: Positive for nausea. Negative for abdominal pain, blood in stool, constipation, diarrhea and vomiting.  Genitourinary: Positive for flank pain. Negative for dysuria, hematuria and urgency.  Musculoskeletal: Negative for myalgias.  Skin: Negative for rash.  Neurological: Negative for dizziness, weakness and light-headedness.     Physical Exam Updated  Vital Signs BP 139/86 (BP Location: Right Arm)    Pulse 70    Temp (!) 97.5 F (36.4 C) (Oral)    SpO2 100%   Physical Exam Vitals signs and nursing note reviewed.  Constitutional:      General: He is not in acute distress.    Appearance: He is well-developed.     Comments: Appears uncomfortable.  HENT:     Head: Normocephalic and atraumatic.     Nose: Nose normal.  Eyes:     General: No scleral icterus.       Right eye: No discharge.        Left eye: No discharge.     Conjunctiva/sclera: Conjunctivae normal.  Neck:     Musculoskeletal: Normal range of motion and neck supple.  Cardiovascular:     Rate and Rhythm: Normal rate and regular rhythm.     Heart sounds: Normal heart sounds. No murmur. No friction rub. No gallop.   Pulmonary:     Effort: Pulmonary effort is normal. No respiratory distress.     Breath sounds: Normal breath sounds.  Abdominal:     General: Bowel sounds are normal. There is no distension.     Palpations: Abdomen is soft.     Tenderness: There is abdominal tenderness (R flank). There is no guarding.  Musculoskeletal: Normal range of motion.  Skin:    General: Skin is warm and dry.     Findings: No rash.  Neurological:     Mental Status: He is alert.     Motor: No abnormal muscle tone.     Coordination: Coordination normal.      ED Treatments / Results  Labs (all labs ordered are listed, but only abnormal results are displayed) Labs Reviewed  URINALYSIS, ROUTINE W REFLEX MICROSCOPIC - Abnormal; Notable for the following components:      Result Value   APPearance HAZY (*)    Hgb urine dipstick LARGE (*)    Protein, ur 30 (*)    RBC / HPF >50 (*)    Bacteria, UA RARE (*)    All other components within normal limits  COMPREHENSIVE METABOLIC PANEL - Abnormal; Notable for the following components:   Glucose, Bld 149 (*)    Total Protein 8.3 (*)    All other components within normal limits  URINE CULTURE  CBC WITH DIFFERENTIAL/PLATELET     EKG None  Radiology Ct Renal Stone Study  Result Date: 12/12/2018 CLINICAL DATA:  Acute onset right flank pain at 1 p.m. today. EXAM: CT ABDOMEN AND PELVIS WITHOUT CONTRAST TECHNIQUE: Multidetector CT imaging of the abdomen and pelvis was performed following the standard protocol without IV contrast. COMPARISON:  None. FINDINGS: Lower chest: Mild dependent atelectasis in the lung bases is greater on the right. Hepatobiliary: No focal liver abnormality is seen. No gallstones, gallbladder wall thickening, or biliary dilatation. Diffuse fatty infiltration of the liver is noted. Pancreas: Unremarkable. No pancreatic ductal dilatation or surrounding inflammatory changes. Spleen: Normal in size without focal abnormality. Adrenals/Urinary Tract: There is mild right hydronephrosis and stranding about the right  kidney and ureter due to a 0.3 cm distal right ureteral stone. No other urinary tract stones are identified. No hydronephrosis on the left. Left ureter appears normal. Urinary bladder is normal in appearance. Stomach/Bowel: Stomach is within normal limits. Appendix appears normal. No evidence of bowel wall thickening, distention, or inflammatory changes. Diverticulosis without diverticulitis is most extensive in the descending and sigmoid colon. Vascular/Lymphatic: No significant vascular findings are present. No enlarged abdominal or pelvic lymph nodes. Reproductive: Mild prostatomegaly. Other: Tiny fat containing umbilical hernia. Musculoskeletal: Bilateral L5 pars interarticularis defects result in 0.7 cm anterolisthesis L5 on S1. Loss of disc space height and endplate sclerosis at 075-GRM are seen. IMPRESSION: Mild right hydronephrosis due to a 0.3 cm distal right ureteral stone. No other urinary tract stones are identified. Fatty infiltration of the liver. Diverticulosis without diverticulitis. Bilateral L5 pars interarticularis defects cause 0.7 cm anterolisthesis L5 on S1. Degenerative disc disease  L4-5 also noted. Electronically Signed   By: Inge Rise M.D.   On: 12/12/2018 16:38    Procedures Procedures (including critical care time)  Medications Ordered in ED Medications  HYDROmorphone (DILAUDID) injection 1 mg (1 mg Intravenous Given 12/12/18 1455)  ondansetron (ZOFRAN) injection 4 mg (4 mg Intravenous Given 12/12/18 1455)  ketorolac (TORADOL) 30 MG/ML injection 30 mg (30 mg Intravenous Given 12/12/18 1605)     Initial Impression / Assessment and Plan / ED Course  I have reviewed the triage vital signs and the nursing notes.  Pertinent labs & imaging results that were available during my care of the patient were reviewed by me and considered in my medical decision making (see chart for details).  Clinical Course as of Dec 12 1642  Thu Dec 12, 2018  1554 RBC / HPF(!): >50 [HK]    Clinical Course User Index [HK] Delia Heady, [place]       53yo M presents to ED for acute onset of right-sided flank pain that woke him up from his sleep approximately 2 hours ago.  States that the pain has been constant, sharp.  He has history of kidney stones but he is unable to remember if this feels similar.  Reports cold sweats but denies history of fever and is afebrile here without any recent use of antipyretics.  States that he was unable to try anything over-the-counter.  On my exam patient appears uncomfortable, tenderness palpation of the right flank, no rebound or guarding noted.  Vital signs are within normal limits.  CBC, CMP is unremarkable with normal renal function.  Urinalysis with hemoglobin, greater than 50 RBCs and rare bacteria.  Will be sent for culture. Findings suspicious for uterolithiasis. CT renal stone study pending.  Patient appears more comfortable with IV pain medication antiemetics.   4:42 PM CT renal stone study shows 3 mm stone which appears to be distal causing mild hydronephrosis.  Patient's pain controlled here.  We will sent home with short course of pain  medication, antiemetics and urology follow-up.  Patient is hemodynamically stable, in NAD, and able to ambulate in the ED. Evaluation does not show pathology that would require ongoing emergent intervention or inpatient treatment. I explained the diagnosis to the patient. Pain has been managed and has no complaints prior to discharge. Patient is comfortable with above plan and is stable for discharge at this time. All questions were answered prior to disposition. Strict return precautions for returning to the ED were discussed. Encouraged follow up with PCP.   An After Visit Summary was printed and  given to the patient.   Portions of this note were generated with [employer]. Dictation errors may occur despite best attempts at proofreading.  Final Clinical Impressions(s) / ED Diagnoses   Final diagnoses:  Ureterolithiasis    ED Discharge Orders         Ordered    oxyCODONE-acetaminophen (PERCOCET/ROXICET) 5-325 MG tablet  Every 8 hours PRN     12/12/18 1642    ondansetron (ZOFRAN ODT) 4 MG disintegrating tablet  Every 8 hours PRN     12/12/18 1642           Delia Heady, PA-C 12/12/18 1644    Long, [facility], MD 12/12/18 2020

## 2018-12-13 LAB — URINE CULTURE: Culture: NO GROWTH

## 2019-01-09 ENCOUNTER — Ambulatory Visit
Admission: RE | Admit: 2019-01-09 | Discharge: 2019-01-09 | Disposition: A | Discharge status: Home / Self Care | Payer: 59 | Source: Ambulatory Visit | Attending: Orthopedic Surgery | Admitting: Orthopedic Surgery

## 2019-01-09 ENCOUNTER — Other Ambulatory Visit: Payer: Self-pay

## 2019-01-09 DIAGNOSIS — M25511 Pain in right shoulder: Secondary | ICD-10-CM

## 2019-01-09 DIAGNOSIS — M5030 Other cervical disc degeneration, unspecified cervical region: Secondary | ICD-10-CM

## 2020-02-03 ENCOUNTER — Encounter: Payer: Self-pay | Admitting: Gastroenterology

## 2020-02-03 NOTE — Progress Notes (Signed)
Primary Care Physician:  Frances Maywood, FNP Primary Gastroenterologist:  Dr. Gala Romney  Chief Complaint  Patient presents with  . Rectal Bleeding    bright red/dark red blood x 3-4 weeks, daily. Getting worse x 1 1/2 weeks. no constipation, no straining    HPI:   Juan Griffith is a 54 y.o. male presenting today with chief complaint of rectal bleeding.  Patient has history of GERD, erosive/ulcerative reflux esophagitis and Schatzki's ring s/p dilation in 2009, rectal bleeding which was evaluated by colonoscopy in 2015 with enlarged internal hemorrhoids, diminutive rectosigmoid polyp (hyperplastic), left-sided diverticulosis, and sigmoid diverticulitis.  He was treated with Cipro and Flagyl x10 days.  Recommended repeat colonoscopy in 10 years.  He had persistent paper hematochezia and underwent banding of the right posterior internal hemorrhoid.  Suspected patient may have also had an anal fissure on top of hemorrhoids at the time of banding and was prescribed nitroglycerin ointment.  Rectal bleeding resolved but returned in 2017.  He underwent banding of the right posterior hemorrhoid column, separate visit with banding of the left lateral internal hemorrhoid.  At his follow-up visit in October 2017, hemorrhoid symptoms and rectal bleeding had resolved.  Patient has not been seen in office since October 2017.  Today: Intermittent bright red/dark red blood per rectum x3-4 months. Was occurring once every couple weeks after golfing. Over the last 3-4 weeks, it has been occurring daily with most every BM. Sometimes only passing blood. Worsening/increasing in volume over the last 1-1/2 weeks.  No black stools. Denies constipation or straining.  When wiping, he has noticed blood clots. No hemorrhoid symptoms. No abdominal pain. Stools are soft with 2-3 BMs daily. No weight loss. No nausea or vomiting.   States symptoms are essentially the same as they were prior to hemorrhoid banding in the past.    Was taking 2 Advil prior to golfing every other week.   Feels tired. No lightheadedness, dizziness, pre-syncope or syncope.   Past Medical History:  Diagnosis Date  . Diverticulosis   . GERD (gastroesophageal reflux disease)   . Hemorrhoids    s/p banding x1 in 2015 and banding x2 in 2017  . Hyperplastic colon polyp   . Schatzki's ring    s/p dilation 2009  . Sigmoid diverticulitis     Past Surgical History:  Procedure Laterality Date  . COLONOSCOPY  12/30/2007   [id number] rectum aside from minimal anal canal hemorrhoids/Extensive left-sided diverticulum.  Colonic mucosa appeared normal  . COLONOSCOPY N/A 04/25/2013   Dr. Gala Romney- engorged internal hemorrhoids- likely source of hematochezia. diminutive rectosigmoid polyp. L sided diverticulosis and sigmoid diverticulitis, hyperplastic polyp.  Due for repeat in 2025.  Marland Kitchen ESOPHAGOGASTRODUODENOSCOPY  12/30/2007   [id number] ring with superimposed distal esophageal erosions/superficial ulcerations consistent with erosive/ulcerative reflux esophagitis status post dilation of Schatzki ring /small HH  . HEMORRHOID BANDING     Dr. Gala Romney; x1 in 2015 and x2 in 2017  . ROTATOR CUFF REPAIR Right     Current Outpatient Medications  Medication Sig Dispense Refill  . omeprazole (PRILOSEC OTC) 20 MG tablet Take 1 tablet (20 mg total) by mouth daily.    . hydrocortisone (ANUSOL-HC) 2.5 % rectal cream Place 1 application rectally 2 (two) times daily. (Patient not taking: Reported on 02/05/2020) 30 g 1   No current facility-administered medications for this visit.    Allergies as of 02/04/2020 - Review Complete 02/04/2020  Allergen Reaction Noted  . Codeine  04/22/2013  . Sulfa antibiotics  04/22/2013    Family History  Problem Relation Age of Onset  . Colon cancer Neg Hx   . Inflammatory bowel disease Neg Hx     Social History   Socioeconomic History  . Marital status: Married    Spouse name: Not on file  . Number of children: 2   . Years of education: Not on file  . Highest education level: Not on file  Occupational History  . Occupation: Clinical biochemist  Tobacco Use  . Smoking status: Never Smoker  . Smokeless tobacco: Current User  . Tobacco comment: dips  Vaping Use  . Vaping Use: Never used  Substance and Sexual Activity  . Alcohol use: Not Currently    Comment: every 6 months   . Drug use: No  . Sexual activity: Not on file  Other Topics Concern  . Not on file  Social History Narrative  . Not on file   Social Determinants of Health   Financial Resource Strain:   . Difficulty of Paying Living Expenses: Not on file  Food Insecurity:   . Worried About Charity fundraiser in the Last Year: Not on file  . Ran Out of Food in the Last Year: Not on file  Transportation Needs:   . Lack of Transportation (Medical): Not on file  . Lack of Transportation (Non-Medical): Not on file  Physical Activity:   . Days of Exercise per Week: Not on file  . Minutes of Exercise per Session: Not on file  Stress:   . Feeling of Stress : Not on file  Social Connections:   . Frequency of Communication with Friends and Family: Not on file  . Frequency of Social Gatherings with Friends and Family: Not on file  . Attends Religious Services: Not on file  . Active Member of Clubs or Organizations: Not on file  . Attends Archivist Meetings: Not on file  . Marital Status: Not on file  Intimate Partner Violence:   . Fear of Current or Ex-Partner: Not on file  . Emotionally Abused: Not on file  . Physically Abused: Not on file  . Sexually Abused: Not on file    Review of Systems: Gen: Denies any fever, chills, cold or flu like symptoms.  CV: Denies chest pain or  heart palpitations.  Resp: Denies shortness of breath or cough.  GI: See HPI MS: Denies joint pain Derm: Denies rash Psych: Denies depression or anxiety Heme: See HPI  Physical Exam: BP 127/77   Pulse 80   Temp 97.7 F (36.5 C)   Ht 6\' 1"   (1.854 m)   Wt 222 lb (100.7 kg)   BMI 29.29 kg/m  General:   Alert and oriented. Pleasant and cooperative. Well-nourished and well-developed.  Head:  Normocephalic and atraumatic. Eyes:  Without icterus, sclera clear and conjunctiva pink.  Ears:  Normal auditory acuity. Lungs:  Clear to auscultation bilaterally. No wheezes, rales, or rhonchi. No distress.  Heart:  S1, S2 present without murmurs appreciated.  Abdomen:  +BS, soft, non-tender and non-distended. No HSM noted. No guarding or rebound. No masses appreciated.  Rectal:  External hemorrhoidal skin tags, tight anal sphincter, somewhat limited internal exam. ?engorged posterior internal hemorrhoidal column. No obvious masses. No bright red blood. Stool was brown.  Msk:  Symmetrical without gross deformities. Normal posture. Extremities:  Without edema. Neurologic:  Alert and  oriented x4;  grossly normal neurologically. Skin:  Intact without significant lesions or rashes. Psych: Normal mood and affect.

## 2020-02-03 NOTE — H&P (View-Only) (Signed)
Primary Care Physician:  Frances Maywood, FNP Primary Gastroenterologist:  Dr. Gala Romney  Chief Complaint  Patient presents with  . Rectal Bleeding    bright red/dark red blood x 3-4 weeks, daily. Getting worse x 1 1/2 weeks. no constipation, no straining    HPI:   Juan Griffith is a 54 y.o. male presenting today with chief complaint of rectal bleeding.  Patient has history of GERD, erosive/ulcerative reflux esophagitis and Schatzki's ring s/p dilation in 2009, rectal bleeding which was evaluated by colonoscopy in 2015 with enlarged internal hemorrhoids, diminutive rectosigmoid polyp (hyperplastic), left-sided diverticulosis, and sigmoid diverticulitis.  He was treated with Cipro and Flagyl x10 days.  Recommended repeat colonoscopy in 10 years.  He had persistent paper hematochezia and underwent banding of the right posterior internal hemorrhoid.  Suspected patient may have also had an anal fissure on top of hemorrhoids at the time of banding and was prescribed nitroglycerin ointment.  Rectal bleeding resolved but returned in 2017.  He underwent banding of the right posterior hemorrhoid column, separate visit with banding of the left lateral internal hemorrhoid.  At his follow-up visit in October 2017, hemorrhoid symptoms and rectal bleeding had resolved.  Patient has not been seen in office since October 2017.  Today: Intermittent bright red/dark red blood per rectum x3-4 months. Was occurring once every couple weeks after golfing. Over the last 3-4 weeks, it has been occurring daily with most every BM. Sometimes only passing blood. Worsening/increasing in volume over the last 1-1/2 weeks.  No black stools. Denies constipation or straining.  When wiping, he has noticed blood clots. No hemorrhoid symptoms. No abdominal pain. Stools are soft with 2-3 BMs daily. No weight loss. No nausea or vomiting.   States symptoms are essentially the same as they were prior to hemorrhoid banding in the past.    Was taking 2 Advil prior to golfing every other week.   Feels tired. No lightheadedness, dizziness, pre-syncope or syncope.   Past Medical History:  Diagnosis Date  . Diverticulosis   . GERD (gastroesophageal reflux disease)   . Hemorrhoids    s/p banding x1 in 2015 and banding x2 in 2017  . Hyperplastic colon polyp   . Schatzki's ring    s/p dilation 2009  . Sigmoid diverticulitis     Past Surgical History:  Procedure Laterality Date  . COLONOSCOPY  12/30/2007   WNI:OEVOJJ rectum aside from minimal anal canal hemorrhoids/Extensive left-sided diverticulum.  Colonic mucosa appeared normal  . COLONOSCOPY N/A 04/25/2013   Dr. Gala Romney- engorged internal hemorrhoids- likely source of hematochezia. diminutive rectosigmoid polyp. L sided diverticulosis and sigmoid diverticulitis, hyperplastic polyp.  Due for repeat in 2025.  Marland Kitchen ESOPHAGOGASTRODUODENOSCOPY  12/30/2007   KKX:FGHWEXHB ring with superimposed distal esophageal erosions/superficial ulcerations consistent with erosive/ulcerative reflux esophagitis status post dilation of Schatzki ring /small HH  . HEMORRHOID BANDING     Dr. Gala Romney; x1 in 2015 and x2 in 2017  . ROTATOR CUFF REPAIR Right     Current Outpatient Medications  Medication Sig Dispense Refill  . omeprazole (PRILOSEC OTC) 20 MG tablet Take 1 tablet (20 mg total) by mouth daily.    . hydrocortisone (ANUSOL-HC) 2.5 % rectal cream Place 1 application rectally 2 (two) times daily. (Patient not taking: Reported on 02/05/2020) 30 g 1   No current facility-administered medications for this visit.    Allergies as of 02/04/2020 - Review Complete 02/04/2020  Allergen Reaction Noted  . Codeine  04/22/2013  . Sulfa antibiotics  04/22/2013    Family History  Problem Relation Age of Onset  . Colon cancer Neg Hx   . Inflammatory bowel disease Neg Hx     Social History   Socioeconomic History  . Marital status: Married    Spouse name: Not on file  . Number of children: 2   . Years of education: Not on file  . Highest education level: Not on file  Occupational History  . Occupation: Clinical biochemist  Tobacco Use  . Smoking status: Never Smoker  . Smokeless tobacco: Current User  . Tobacco comment: dips  Vaping Use  . Vaping Use: Never used  Substance and Sexual Activity  . Alcohol use: Not Currently    Comment: every 6 months   . Drug use: No  . Sexual activity: Not on file  Other Topics Concern  . Not on file  Social History Narrative  . Not on file   Social Determinants of Health   Financial Resource Strain:   . Difficulty of Paying Living Expenses: Not on file  Food Insecurity:   . Worried About Charity fundraiser in the Last Year: Not on file  . Ran Out of Food in the Last Year: Not on file  Transportation Needs:   . Lack of Transportation (Medical): Not on file  . Lack of Transportation (Non-Medical): Not on file  Physical Activity:   . Days of Exercise per Week: Not on file  . Minutes of Exercise per Session: Not on file  Stress:   . Feeling of Stress : Not on file  Social Connections:   . Frequency of Communication with Friends and Family: Not on file  . Frequency of Social Gatherings with Friends and Family: Not on file  . Attends Religious Services: Not on file  . Active Member of Clubs or Organizations: Not on file  . Attends Archivist Meetings: Not on file  . Marital Status: Not on file  Intimate Partner Violence:   . Fear of Current or Ex-Partner: Not on file  . Emotionally Abused: Not on file  . Physically Abused: Not on file  . Sexually Abused: Not on file    Review of Systems: Gen: Denies any fever, chills, cold or flu like symptoms.  CV: Denies chest pain or  heart palpitations.  Resp: Denies shortness of breath or cough.  GI: See HPI MS: Denies joint pain Derm: Denies rash Psych: Denies depression or anxiety Heme: See HPI  Physical Exam: BP 127/77   Pulse 80   Temp 97.7 F (36.5 C)   Ht 6\' 1"   (1.854 m)   Wt 222 lb (100.7 kg)   BMI 29.29 kg/m  General:   Alert and oriented. Pleasant and cooperative. Well-nourished and well-developed.  Head:  Normocephalic and atraumatic. Eyes:  Without icterus, sclera clear and conjunctiva pink.  Ears:  Normal auditory acuity. Lungs:  Clear to auscultation bilaterally. No wheezes, rales, or rhonchi. No distress.  Heart:  S1, S2 present without murmurs appreciated.  Abdomen:  +BS, soft, non-tender and non-distended. No HSM noted. No guarding or rebound. No masses appreciated.  Rectal:  External hemorrhoidal skin tags, tight anal sphincter, somewhat limited internal exam. ?engorged posterior internal hemorrhoidal column. No obvious masses. No bright red blood. Stool was brown.  Msk:  Symmetrical without gross deformities. Normal posture. Extremities:  Without edema. Neurologic:  Alert and  oriented x4;  grossly normal neurologically. Skin:  Intact without significant lesions or rashes. Psych: Normal mood and affect.

## 2020-02-04 ENCOUNTER — Encounter: Payer: Self-pay | Admitting: Gastroenterology

## 2020-02-04 ENCOUNTER — Ambulatory Visit (INDEPENDENT_AMBULATORY_CARE_PROVIDER_SITE_OTHER): Payer: 59 | Admitting: Gastroenterology

## 2020-02-04 ENCOUNTER — Other Ambulatory Visit: Payer: Self-pay

## 2020-02-04 VITALS — BP 127/77 | HR 80 | Temp 97.7°F | Ht 73.0 in | Wt 222.0 lb

## 2020-02-04 DIAGNOSIS — K625 Hemorrhage of anus and rectum: Secondary | ICD-10-CM

## 2020-02-04 LAB — CBC WITH DIFFERENTIAL/PLATELET
Absolute Monocytes: 864 cells/uL (ref 200–950)
Basophils Absolute: 48 cells/uL (ref 0–200)
Basophils Relative: 0.5 %
Eosinophils Absolute: 269 cells/uL (ref 15–500)
Eosinophils Relative: 2.8 %
HCT: 38.1 % — ABNORMAL LOW (ref 38.5–50.0)
Hemoglobin: 13.1 g/dL — ABNORMAL LOW (ref 13.2–17.1)
Lymphs Abs: 2890 cells/uL (ref 850–3900)
MCH: 28.7 pg (ref 27.0–33.0)
MCHC: 34.4 g/dL (ref 32.0–36.0)
MCV: 83.6 fL (ref 80.0–100.0)
MPV: 10.8 fL (ref 7.5–12.5)
Monocytes Relative: 9 %
Neutro Abs: 5530 cells/uL (ref 1500–7800)
Neutrophils Relative %: 57.6 %
Platelets: 323 10*3/uL (ref 140–400)
RBC: 4.56 10*6/uL (ref 4.20–5.80)
RDW: 13.5 % (ref 11.0–15.0)
Total Lymphocyte: 30.1 %
WBC: 9.6 10*3/uL (ref 3.8–10.8)

## 2020-02-04 MED ORDER — HYDROCORTISONE (PERIANAL) 2.5 % EX CREA: 1.0000 "application " | TOPICAL_CREAM | Freq: Two times a day (BID) | CUTANEOUS | 1 refills | Status: DC

## 2020-02-04 NOTE — Patient Instructions (Signed)
Please have blood work completed today at [facility] or [facility]   Please start Anusol rectal cream twice daily for the next 7-10 days.   We will determine timing of colonoscopy following your blood work.   If you have any worsening symptoms, lightheadedness, dizziness, or feeling like you will pass out, you will need to proceed to the emergency room.   Aliene Altes, PA-C [facility]

## 2020-02-05 ENCOUNTER — Other Ambulatory Visit (HOSPITAL_COMMUNITY)
Admission: RE | Admit: 2020-02-05 | Discharge: 2020-02-05 | Disposition: A | Discharge status: Home / Self Care | Payer: 59 | Source: Ambulatory Visit | Attending: Internal Medicine | Admitting: Internal Medicine

## 2020-02-05 ENCOUNTER — Encounter: Payer: Self-pay | Admitting: Gastroenterology

## 2020-02-05 ENCOUNTER — Encounter: Payer: Self-pay | Admitting: *Deleted

## 2020-02-05 DIAGNOSIS — Z20822 Contact with and (suspected) exposure to covid-19: Secondary | ICD-10-CM | POA: Diagnosis not present

## 2020-02-05 DIAGNOSIS — Z01818 Encounter for other preprocedural examination: Secondary | ICD-10-CM | POA: Diagnosis not present

## 2020-02-05 LAB — SARS CORONAVIRUS 2 (TAT 6-24 HRS): SARS Coronavirus 2: NEGATIVE

## 2020-02-05 NOTE — Assessment & Plan Note (Addendum)
54 year old male with history of rectal bleeding previously evaluated by colonoscopy in 2015 revealing enlarge internal hemorrhoids, one hyperplastic polyp, left-sided diverticulosis, and sigmoid diverticulitis.  He was treated with Cipro and Flagyl.  Ultimately, he had persistent rectal bleeding and underwent a hemorrhoid banding x1 in 2015 and x2 in 2017 after which rectal bleeding resolved.  Now with 3-4 months of intermittent, painless bright red/dark red blood per rectum that has become daily/multiple times daily and occasionally passing blood only per rectum over the last 3-4 weeks.  Denies any hemorrhoid symptoms, abdominal pain, constipation, straining, or weight loss.  Feels tired.  Had been taking 2 Advil prior to golfing every other week.  Abdominal exam benign.  Rectal exam with some external hemorrhoidal skin tags, tight anal sphincter with somewhat limited internal exam,? Engorged posterior internal hemorrhoidal column, no obvious masses or bright red blood.  Differentials for rectal bleeding include hemorrhoids, diverticular bleed, colon polyps, AVMs, and cannot rule out malignancy.    Notably, patient has plans to go out of town for 3 weeks this coming Friday.  Ideally, I would like for him to have a colonoscopy in the very near future.  We discussed updating his CBC and if any evidence of decline in hemoglobin, we would try to get him scheduled ASAP.  Patient stated he could fly out at a later date if colonoscopy is needed.  He is also concerned about this rectal bleeding and feels it is more important than his trip.  Plan: CBC today. Anusol rectal cream BID x7-10 days for empiric treatment of hemorrhoids.  Plan to proceed with colonoscopy with propofol with Dr. Gala Romney in the near future.  Discussed if needed, we may schedule with Dr. Abbey Chatters due to timing. The risks, benefits, and alternatives have been discussed with the patient in detail. The patient states understanding and desires to  proceed.  ASA II ER precautions discussed.  Follow-up after procedure.

## 2020-02-05 NOTE — Progress Notes (Signed)
Hemoglobin has declined slightly to 13.1 from 13.8, 1 year ago.   I would like to get him scheduled for a colonoscopy in the very near future. I know he is going out of town for 3 weeks. As I am not sure where his rectal bleeding is coming from, I can not guarentee that his bleeding will not worsen and that his hemoglobin may not decline more. We have an opening on Monday for a colonoscopy with Dr. Gala Romney if he would like to go ahead and get this scheduled.   [facility] Clinical Pool: If patient is agreeable, please arrange TCS with propofol with [facility] on Monday. ASA II. Dx: Rectal bleeding.   The other option would be to get him scheduled with Dr. Abbey Chatters if he has an opening on Friday if patient hasn't eaten breakfast today.

## 2020-02-06 ENCOUNTER — Ambulatory Visit (HOSPITAL_COMMUNITY)
Admission: RE | Admit: 2020-02-06 | Discharge: 2020-02-06 | Disposition: A | Discharge status: Home / Self Care | Payer: 59 | Attending: Internal Medicine | Admitting: Internal Medicine

## 2020-02-06 ENCOUNTER — Other Ambulatory Visit: Payer: Self-pay

## 2020-02-06 ENCOUNTER — Encounter (HOSPITAL_COMMUNITY): Payer: Self-pay

## 2020-02-06 ENCOUNTER — Encounter (HOSPITAL_COMMUNITY)
Admission: RE | Disposition: A | Discharge status: Home / Self Care | Payer: Self-pay | Source: Home / Self Care | Attending: Internal Medicine

## 2020-02-06 ENCOUNTER — Ambulatory Visit (HOSPITAL_COMMUNITY): Payer: 59 | Admitting: Anesthesiology

## 2020-02-06 DIAGNOSIS — K625 Hemorrhage of anus and rectum: Secondary | ICD-10-CM | POA: Insufficient documentation

## 2020-02-06 DIAGNOSIS — K648 Other hemorrhoids: Secondary | ICD-10-CM | POA: Insufficient documentation

## 2020-02-06 DIAGNOSIS — Z882 Allergy status to sulfonamides status: Secondary | ICD-10-CM | POA: Insufficient documentation

## 2020-02-06 DIAGNOSIS — D125 Benign neoplasm of sigmoid colon: Secondary | ICD-10-CM | POA: Diagnosis not present

## 2020-02-06 DIAGNOSIS — Z79899 Other long term (current) drug therapy: Secondary | ICD-10-CM | POA: Diagnosis not present

## 2020-02-06 DIAGNOSIS — K635 Polyp of colon: Secondary | ICD-10-CM

## 2020-02-06 DIAGNOSIS — Z885 Allergy status to narcotic agent status: Secondary | ICD-10-CM | POA: Insufficient documentation

## 2020-02-06 DIAGNOSIS — I868 Varicose veins of other specified sites: Secondary | ICD-10-CM | POA: Insufficient documentation

## 2020-02-06 DIAGNOSIS — K219 Gastro-esophageal reflux disease without esophagitis: Secondary | ICD-10-CM | POA: Insufficient documentation

## 2020-02-06 HISTORY — DX: Personal history of urinary calculi: Z87.442

## 2020-02-06 HISTORY — PX: POLYPECTOMY: SHX5525

## 2020-02-06 HISTORY — PX: COLONOSCOPY WITH PROPOFOL: SHX5780

## 2020-02-06 SURGERY — COLONOSCOPY WITH PROPOFOL
Anesthesia: General

## 2020-02-06 MED ORDER — LACTATED RINGERS IV SOLN
INTRAVENOUS | Status: DC
  Administered 2020-02-06: 1000 mL via INTRAVENOUS

## 2020-02-06 MED ORDER — CHLORHEXIDINE GLUCONATE CLOTH 2 % EX PADS: 6.0000 | MEDICATED_PAD | Freq: Once | CUTANEOUS | Status: DC

## 2020-02-06 MED ORDER — LIDOCAINE HCL (CARDIAC) PF 100 MG/5ML IV SOSY
PREFILLED_SYRINGE | INTRAVENOUS | Status: DC | PRN
  Administered 2020-02-06: 80 mg via INTRAVENOUS

## 2020-02-06 MED ORDER — STERILE WATER FOR IRRIGATION IR SOLN
Status: DC | PRN
  Administered 2020-02-06: 1.5 mL

## 2020-02-06 MED ORDER — PROPOFOL 10 MG/ML IV BOLUS
INTRAVENOUS | Status: DC | PRN
  Administered 2020-02-06: 150 ug/kg/min via INTRAVENOUS
  Administered 2020-02-06: 100 mg via INTRAVENOUS

## 2020-02-06 NOTE — Transfer of Care (Signed)
Immediate Anesthesia Transfer of Care Note  Patient: Juan Griffith  Procedure(s) Performed: COLONOSCOPY WITH PROPOFOL (N/A ) POLYPECTOMY  Patient Location: Endoscopy Unit  Anesthesia Type:General  Level of Consciousness: awake, alert , oriented and patient cooperative  Airway & Oxygen Therapy: Patient Spontanous Breathing  Post-op Assessment: Report given to RN, Post -op Vital signs reviewed and stable and Patient moving all extremities  Post vital signs: Reviewed and stable  Last Vitals:  Vitals Value Taken Time  BP    Temp    Pulse    Resp    SpO2      Last Pain:  Vitals:   02/06/20 1124  TempSrc:   PainSc: 0-No pain      Patients Stated Pain Goal: 7 (33/35/45 6256)  Complications: No complications documented.

## 2020-02-06 NOTE — Anesthesia Postprocedure Evaluation (Signed)
Anesthesia Post Note  Patient: Juan Griffith  Procedure(s) Performed: COLONOSCOPY WITH PROPOFOL (N/A ) POLYPECTOMY  Patient location during evaluation: Endoscopy Anesthesia Type: General Level of consciousness: awake, oriented, awake and alert and patient cooperative Pain management: pain level controlled Vital Signs Assessment: post-procedure vital signs reviewed and stable Respiratory status: spontaneous breathing, nonlabored ventilation and respiratory function stable Cardiovascular status: blood pressure returned to baseline and stable Postop Assessment: no headache and no backache Anesthetic complications: no   No complications documented.   Last Vitals:  Vitals:   02/06/20 1031  BP: 108/76  Pulse: 67  Resp: 15  Temp: 36.8 C  SpO2: 96%    Last Pain:  Vitals:   02/06/20 1124  TempSrc:   PainSc: 0-No pain                 Tacy Learn

## 2020-02-06 NOTE — Anesthesia Preprocedure Evaluation (Signed)
Anesthesia Evaluation  Patient identified by MRN, date of birth, ID band Patient awake    Reviewed: Allergy & Precautions, H&P , NPO status , Patient's Chart, lab work & pertinent test results, reviewed documented beta blocker date and time   Airway Mallampati: II  TM Distance: >3 FB Neck ROM: full    Dental no notable dental hx. (+) Teeth Intact   Pulmonary neg pulmonary ROS,    Pulmonary exam normal breath sounds clear to auscultation       Cardiovascular Exercise Tolerance: Good negative cardio ROS   Rhythm:regular Rate:Normal     Neuro/Psych negative neurological ROS  negative psych ROS   GI/Hepatic Neg liver ROS, GERD  Medicated,  Endo/Other  negative endocrine ROS  Renal/GU negative Renal ROS  negative genitourinary   Musculoskeletal   Abdominal   Peds  Hematology negative hematology ROS (+)   Anesthesia Other Findings   Reproductive/Obstetrics negative OB ROS                             Anesthesia Physical Anesthesia Plan  ASA: II  Anesthesia Plan: General   Post-op Pain Management:    Induction:   PONV Risk Score and Plan: Propofol infusion  Airway Management Planned:   Additional Equipment:   Intra-op Plan:   Post-operative Plan:   Informed Consent: I have reviewed the patients History and Physical, chart, labs and discussed the procedure including the risks, benefits and alternatives for the proposed anesthesia with the patient or authorized representative who has indicated his/her understanding and acceptance.     Dental Advisory Given  Plan Discussed with: CRNA  Anesthesia Plan Comments:         Anesthesia Quick Evaluation  

## 2020-02-06 NOTE — Op Note (Signed)
[facility] Patient Name: Juan Griffith Procedure Date: 02/06/2020 11:10 AM MRN: [id number] Date of Birth: 1966/01/28 Attending MD: Elon Alas. Abbey Chatters DO CSN: [id number] Age: 54 Admit Type: Outpatient Procedure:                Colonoscopy Indications:              Rectal bleeding Providers:                Elon Alas. Abbey Chatters, DO, Caprice Kluver, Casimer Bilis, Technician Referring MD:              Medicines:                See the Anesthesia note for documentation of the                            administered medications Complications:            No immediate complications. Estimated Blood Loss:     Estimated blood loss was minimal. Procedure:                Pre-Anesthesia Assessment:                           - The anesthesia plan was to use monitored                            anesthesia care (MAC).                           After obtaining informed consent, the colonoscope                            was passed under direct vision. Throughout the                            procedure, the patient's blood pressure, pulse, and                            oxygen saturations were monitored continuously. The                            [id number]-HQ190L ([id number]) scope was introduced through                            the anus and advanced to the the cecum, identified                            by appendiceal orifice and ileocecal valve. The                            colonoscopy was performed without difficulty. The                            patient tolerated the procedure well. The quality  of the bowel preparation was evaluated using the                            BBPS [facility] Bowel Preparation Scale) with scores                            of: Right Colon = 2 (minor amount of residual                            staining, small fragments of stool and/or opaque                            liquid, but mucosa seen well), Transverse Colon  = 3                            (entire mucosa seen well with no residual staining,                            small fragments of stool or opaque liquid) and Left                            Colon = 3 (entire mucosa seen well with no residual                            staining, small fragments of stool or opaque                            liquid). The total BBPS score equals 8. The quality                            of the bowel preparation was good. Scope In: 11:27:15 AM Scope Out: 11:41:03 AM Scope Withdrawal Time: 0 hours 7 minutes 30 seconds  Total Procedure Duration: 0 hours 13 minutes 48 seconds  Findings:      The perianal and digital rectal examinations were normal.      Non-bleeding internal hemorrhoids were found during retroflexion. The       hemorrhoids were large.      A 4 mm polyp was found in the sigmoid colon. The polyp was sessile. The       polyp was removed with a cold snare. Resection and retrieval were       complete.      The terminal ileum appeared normal.      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 Impression:               - Non-bleeding internal hemorrhoids.                           - One 4 mm polyp in the sigmoid colon, removed with                            a cold snare. Resected and retrieved.                           -  The examined portion of the ileum was normal.                           - Rectal varices. Moderate Sedation:      Per Anesthesia Care Recommendation:           - Patient has a contact number available for                            emergencies. The signs and symptoms of potential                            delayed complications were discussed with the                            patient. Return to normal activities tomorrow.                            Written discharge instructions were provided to the                            patient.                           - Resume previous diet.                           - Continue  present medications.                           - Await pathology results.                           - Repeat colonoscopy in 5 years for surveillance.                           - Return to GI clinic in 8 weeks with Aliene Altes. Will likely need further workup for chronic                            liver disease.                           - Hemorrhoid banding contraindicated in setting of                            likely rectal varices. Procedure Code(s):        --- Professional ---                           [phone], Colonoscopy, flexible; with removal of                            tumor(s), polyp(s), or other lesion(s) by snare  technique Diagnosis Code(s):        --- Professional ---                           K63.5, Polyp of colon                           K64.8, Other hemorrhoids                           K62.5, Hemorrhage of anus and rectum CPT copyright 2019 [employer]. All rights reserved. The codes documented in this report are preliminary and upon coder review may  be revised to meet current compliance requirements. Elon Alas. Abbey Chatters, DO [place] Abbey Chatters, DO 02/06/2020 12:39:30 PM This report has been signed electronically. Number of Addenda: 0

## 2020-02-06 NOTE — Discharge Instructions (Addendum)
Colonoscopy Discharge Instructions  Read the instructions outlined below and refer to this sheet in the next few weeks. These discharge instructions provide you with general information on caring for yourself after you leave the hospital. Your doctor may also give you specific instructions. While your treatment has been planned according to the most current medical practices available, unavoidable complications occasionally occur.   ACTIVITY  You may resume your regular activity, but move at a slower pace for the next 24 hours.   Take frequent rest periods for the next 24 hours.   Walking will help get rid of the air and reduce the bloated feeling in your belly (abdomen).   No driving for 24 hours (because of the medicine (anesthesia) used during the test).    Do not sign any important legal documents or operate any machinery for 24 hours (because of the anesthesia used during the test).  NUTRITION  Drink plenty of fluids.   You may resume your normal diet as instructed by your doctor.   Begin with a light meal and progress to your normal diet. Heavy or fried foods are harder to digest and may make you feel sick to your stomach (nauseated).   Avoid alcoholic beverages for 24 hours or as instructed.  MEDICATIONS  You may resume your normal medications unless your doctor tells you otherwise.  WHAT YOU CAN EXPECT TODAY  Some feelings of bloating in the abdomen.   Passage of more gas than usual.   Spotting of blood in your stool or on the toilet paper.  IF YOU HAD POLYPS REMOVED DURING THE COLONOSCOPY:  No aspirin products for 7 days or as instructed.   No alcohol for 7 days or as instructed.   Eat a soft diet for the next 24 hours.  FINDING OUT THE RESULTS OF YOUR TEST Not all test results are available during your visit. If your test results are not back during the visit, make an appointment with your caregiver to find out the results. Do not assume everything is normal if  you have not heard from your caregiver or the medical facility. It is important for you to follow up on all of your test results.  SEEK IMMEDIATE MEDICAL ATTENTION IF:  You have more than a spotting of blood in your stool.   Your belly is swollen (abdominal distention).   You are nauseated or vomiting.   You have a temperature over 101.   You have abdominal pain or discomfort that is severe or gets worse throughout the day.   Your colonoscopy revealed one polyp which I removed successfully.  Await pathology results, my office will contact you.  Repeat colonoscopy in 5 years for surveillance purposes.  You also have internal hemorrhoids.  I did find engorged veins in the rectal vault which may indicate rectal varices as well.  I would hold off on hemorrhoid banding for now.  Follow-up with Aliene Altes in 6 to 8 weeks GI work-up for possible chronic liver disease.  I hope you have a great rest of your week!  Elon Alas. Abbey Chatters, D.O. Gastroenterology and Hepatology [facility]   Colon Polyps  Polyps are tissue growths inside the body. Polyps can grow in many places, including the large intestine (colon). A polyp may be a round bump or a mushroom-shaped growth. You could have one polyp or several. Most colon polyps are noncancerous (benign). However, some colon polyps can become cancerous over time. Finding and removing the polyps early can help  prevent this. What are the causes? The exact cause of colon polyps is not known. What increases the risk? You are more likely to develop this condition if you:  Have a family history of colon cancer or colon polyps.  Are older than 8 or older than 45 if you are African American.  Have inflammatory bowel disease, such as ulcerative colitis or Crohn's disease.  Have certain hereditary conditions, such as: ? Familial adenomatous polyposis. ? Lynch syndrome. ? Turcot syndrome. ? Peutz-Jeghers syndrome.  Are  overweight.  Smoke cigarettes.  Do not get enough exercise.  Drink too much alcohol.  Eat a diet that is high in fat and red meat and low in fiber.  Had childhood cancer that was treated with abdominal radiation. What are the signs or symptoms? Most polyps do not cause symptoms. If you have symptoms, they may include:  Blood coming from your rectum when having a bowel movement.  Blood in your stool. The stool may look dark red or black.  Abdominal pain.  A change in bowel habits, such as constipation or diarrhea. How is this diagnosed? This condition is diagnosed with a colonoscopy. This is a procedure in which a lighted, flexible scope is inserted into the anus and then passed into the colon to examine the area. Polyps are sometimes found when a colonoscopy is done as part of routine cancer screening tests. How is this treated? Treatment for this condition involves removing any polyps that are found. Most polyps can be removed during a colonoscopy. Those polyps will then be tested for cancer. Additional treatment may be needed depending on the results of testing. Follow these instructions at home: Lifestyle  Maintain a healthy weight, or lose weight if recommended by your health care provider.  Exercise every day or as told by your health care provider.  Do not use any products that contain nicotine or tobacco, such as cigarettes and e-cigarettes. If you need help quitting, ask your health care provider.  If you drink alcohol, limit how much you have: ? 0-1 drink a day for women. ? 0-2 drinks a day for men.  Be aware of how much alcohol is in your drink. In the [place] one drink equals one 12 oz bottle of beer (355 mL), one 5 oz glass of wine (148 mL), or one 1 oz shot of hard liquor (44 mL). Eating and drinking   Eat foods that are high in fiber, such as fruits, vegetables, and whole grains.  Eat foods that are high in calcium and vitamin D, such as milk, cheese, yogurt,  eggs, liver, fish, and broccoli.  Limit foods that are high in fat, such as fried foods and desserts.  Limit the amount of red meat and processed meat you eat, such as hot dogs, sausage, bacon, and lunch meats. General instructions  Keep all follow-up visits as told by your health care provider. This is important. ? This includes having regularly scheduled colonoscopies. ? Talk to your health care provider about when you need a colonoscopy. Contact a health care provider if:  You have new or worsening bleeding during a bowel movement.  You have new or increased blood in your stool.  You have a change in bowel habits.  You lose weight for no known reason. Summary  Polyps are tissue growths inside the body. Polyps can grow in many places, including the colon.  Most colon polyps are noncancerous (benign), but some can become cancerous over time.  This condition is diagnosed  with a colonoscopy.  Treatment for this condition involves removing any polyps that are found. Most polyps can be removed during a colonoscopy. This information is not intended to replace advice given to you by your health care provider. Make sure you discuss any questions you have with your health care provider. Document Revised: 07/19/2017 Document Reviewed: 07/19/2017 Elsevier Patient Education  [place].    Hemorrhoids Hemorrhoids are swollen veins in and around the rectum or anus. There are two types of hemorrhoids:  Internal hemorrhoids. These occur in the veins that are just inside the rectum. They may poke through to the outside and become irritated and painful.  External hemorrhoids. These occur in the veins that are outside the anus and can be felt as a painful swelling or hard lump near the anus. Most hemorrhoids do not cause serious problems, and they can be managed with home treatments such as diet and lifestyle changes. If home treatments do not help the symptoms, procedures can be done  to shrink or remove the hemorrhoids. What are the causes? This condition is caused by increased pressure in the anal area. This pressure may result from various things, including:  Constipation.  Straining to have a bowel movement.  Diarrhea.  Pregnancy.  Obesity.  Sitting for long periods of time.  Heavy lifting or other activity that causes you to strain.  Anal sex.  Riding a bike for a long period of time. What are the signs or symptoms? Symptoms of this condition include:  Pain.  Anal itching or irritation.  Rectal bleeding.  Leakage of stool (feces).  Anal swelling.  One or more lumps around the anus. How is this diagnosed? This condition can often be diagnosed through a visual exam. Other exams or tests may also be done, such as:  An exam that involves feeling the rectal area with a gloved hand (digital rectal exam).  An exam of the anal canal that is done using a small tube (anoscope).  A blood test, if you have lost a significant amount of blood.  A test to look inside the colon using a flexible tube with a camera on the end (sigmoidoscopy or colonoscopy). How is this treated? This condition can usually be treated at home. However, various procedures may be done if dietary changes, lifestyle changes, and other home treatments do not help your symptoms. These procedures can help make the hemorrhoids smaller or remove them completely. Some of these procedures involve surgery, and others do not. Common procedures include:  Rubber band ligation. Rubber bands are placed at the base of the hemorrhoids to cut off their blood supply.  Sclerotherapy. Medicine is injected into the hemorrhoids to shrink them.  Infrared coagulation. A type of light energy is used to get rid of the hemorrhoids.  Hemorrhoidectomy surgery. The hemorrhoids are surgically removed, and the veins that supply them are tied off.  Stapled hemorrhoidopexy surgery. The surgeon staples the base  of the hemorrhoid to the rectal wall. Follow these instructions at home: Eating and drinking   Eat foods that have a lot of fiber in them, such as whole grains, beans, nuts, fruits, and vegetables.  Ask your health care provider about taking products that have added fiber (fiber supplements).  Reduce the amount of fat in your diet. You can do this by eating low-fat dairy products, eating less red meat, and avoiding processed foods.  Drink enough fluid to keep your urine pale yellow. Managing pain and swelling   Take warm  sitz baths for 20 minutes, 3-4 times a day to ease pain and discomfort. You may do this in a bathtub or using a portable sitz bath that fits over the toilet.  If directed, apply ice to the affected area. Using ice packs between sitz baths may be helpful. ? Put ice in a plastic bag. ? Place a towel between your skin and the bag. ? Leave the ice on for 20 minutes, 2-3 times a day. General instructions  Take over-the-counter and prescription medicines only as told by your health care provider.  Use medicated creams or suppositories as told.  Get regular exercise. Ask your health care provider how much and what kind of exercise is best for you. In general, you should do moderate exercise for at least 30 minutes on most days of the week (150 minutes each week). This can include activities such as walking, biking, or yoga.  Go to the bathroom when you have the urge to have a bowel movement. Do not wait.  Avoid straining to have bowel movements.  Keep the anal area dry and clean. Use wet toilet paper or moist towelettes after a bowel movement.  Do not sit on the toilet for long periods of time. This increases blood pooling and pain.  Keep all follow-up visits as told by your health care provider. This is important. Contact a health care provider if you have:  Increasing pain and swelling that are not controlled by treatment or medicine.  Difficulty having a bowel  movement, or you are unable to have a bowel movement.  Pain or inflammation outside the area of the hemorrhoids. Get help right away if you have:  Uncontrolled bleeding from your rectum. Summary  Hemorrhoids are swollen veins in and around the rectum or anus.  Most hemorrhoids can be managed with home treatments such as diet and lifestyle changes.  Taking warm sitz baths can help ease pain and discomfort.  In severe cases, procedures or surgery can be done to shrink or remove the hemorrhoids. This information is not intended to replace advice given to you by your health care provider. Make sure you discuss any questions you have with your health care provider. Document Revised: 08/30/2018 Document Reviewed: 08/23/2017 Elsevier Patient Education  [place].

## 2020-02-06 NOTE — Interval H&P Note (Signed)
History and Physical Interval Note:  02/06/2020 11:22 AM  Juan Griffith  has presented today for surgery, with the diagnosis of rectal bleeding.  The various methods of treatment have been discussed with the patient and family. After consideration of risks, benefits and other options for treatment, the patient has consented to  Procedure(s) with comments: COLONOSCOPY WITH PROPOFOL (N/A) - 11:30am as a surgical intervention.  The patient's history has been reviewed, patient examined, no change in status, stable for surgery.  I have reviewed the patient's chart and labs.  Questions were answered to the patient's satisfaction.     Eloise Harman

## 2020-02-06 NOTE — Progress Notes (Signed)
CC'ED TO PCP 

## 2020-02-09 ENCOUNTER — Telehealth: Payer: Self-pay

## 2020-02-09 LAB — SURGICAL PATHOLOGY

## 2020-02-09 NOTE — Telephone Encounter (Signed)
Pts spouse called to get pt scheduled for a 6-8 week follow up after pts procedure. Pt was scheduled a f/u with Aliene Altes, PA for 03/26/20 @ 9:00. Offered to have pt scheduled with Dr. Abbey Chatters when his schedule opens up and spouse wanted an apt scheduled today due to pts busy schedule.

## 2020-02-11 ENCOUNTER — Encounter (HOSPITAL_COMMUNITY): Payer: Self-pay | Admitting: Internal Medicine

## 2020-02-17 ENCOUNTER — Telehealth: Payer: Self-pay | Admitting: Internal Medicine

## 2020-02-17 NOTE — Telephone Encounter (Signed)
Returned call to pts wife advised of the results and also let her know I had attempted x2 to call her husband but his voicemail has not been set up.

## 2020-02-17 NOTE — Telephone Encounter (Signed)
Pt's wife was calling to follow up on patient's colonoscopy results. Procedure was done 02/06/2020. Please call patient at [phone] or his wife [phone]

## 2020-03-25 NOTE — Progress Notes (Signed)
Referring Provider: Frances Maywood, FNP Primary Care Physician:  Frances Maywood, FNP Primary GI Physician: Dr. Gala Romney  Chief Complaint  Patient presents with  . Rectal Bleeding    Still ongoing daily    HPI:   Juan Griffith is a 54 y.o. male presenting today for follow-up of rectal bleeding s/p colonoscopy. Also with GERD, erosive/ulcerative reflux esophagitis and Schatzki's ring s/p dilation in 2009, rectal bleeding which was evaluated by colonoscopy in 2015 with enlarged internal hemorrhoids, diminutive rectosigmoid polyp (hyperplastic), left-sided diverticulosis, and sigmoid diverticulitis.  Also with hemorrhoid banding x3.   Last seen in October 2021 reporting 3 to 4 months of intermittent bright red/dark red blood per rectum that has been worsening and was passing blood alone at times.  No abdominal pain, constipation, diarrhea, weight loss, or any other significant GI symptoms.  He was prescribed Anusol twice daily.  CBC was updated with hemoglobin essentially stable at 13.1.  Colonoscopy 02/06/2020: Large nonbleeding internal hemorrhoids, 4 mm polyp in sigmoid colon (tubular adenoma), TI normal, medium size nonbleeding rectal varices.  Recommend repeat colonoscopy in 5 years and follow-up in the office for further evaluation of suspected chronic liver disease in setting of rectal varices.  Hemorrhoid banding contraindicated.  Today: Rectal bleeding had resolved after colonoscopy until 11/15. Had once episode. Then returned on 11/18. Since then, rectal bleeding is most every day to every other day, but not with every BM. No constipation or straining. Occasional heavy lifting. No NSAIDs. States he energy level is down when he has bleeding. Blood is in the water, on toilet tissue, and on the stool. Never used the Anusol cream. Not currently using anything for rectal bleeding. He does have Anusol cream at home from prior visit.  Denies melena.  Occasional minimal swelling in ankles.  No swelling in abdomen. No yellowing of eyes, bruising, or confusion. No alcohol. Used to drink when he was younger-states he hasn't had 5 beer since he was 29. No history drug use. No family history of liver disease. Dad had some trouble with his thyroid. No known autoimmune conditions in the family.   GERD is well controlled. No dysphagia. No nausea or vomiting. No abdominal pain. Weight is fairly stable.   Past Medical History:  Diagnosis Date  . Diverticulosis   . GERD (gastroesophageal reflux disease)   . Hemorrhoids    s/p banding x1 in 2015 and banding x2 in 2017  . History of kidney stones   . Hyperplastic colon polyp   . Schatzki's ring    s/p dilation 2009  . Sigmoid diverticulitis     Past Surgical History:  Procedure Laterality Date  . COLONOSCOPY  12/30/2007   [id number] rectum aside from minimal anal canal hemorrhoids/Extensive left-sided diverticulum.  Colonic mucosa appeared normal  . COLONOSCOPY N/A 04/25/2013   Dr. Gala Romney- engorged internal hemorrhoids- likely source of hematochezia. diminutive rectosigmoid polyp. L sided diverticulosis and sigmoid diverticulitis, hyperplastic polyp.  Due for repeat in 2025.  Marland Kitchen COLONOSCOPY WITH PROPOFOL N/A 02/06/2020   Procedure: COLONOSCOPY WITH PROPOFOL;  Surgeon: Eloise Harman, DO;  Location: [facility];  Service: Endoscopy;  Laterality: N/A;  11:30am  . ESOPHAGOGASTRODUODENOSCOPY  12/30/2007   [id number] ring with superimposed distal esophageal erosions/superficial ulcerations consistent with erosive/ulcerative reflux esophagitis status post dilation of Schatzki ring /small HH  . HEMORRHOID BANDING     Dr. Gala Romney; x1 in 2015 and x2 in 2017  . NOSE SURGERY  1985  . POLYPECTOMY  02/06/2020  Procedure: POLYPECTOMY;  Surgeon: Eloise Harman, DO;  Location: [facility];  Service: Endoscopy;;  . ROTATOR CUFF REPAIR Right     Current Outpatient Medications  Medication Sig Dispense Refill  . omeprazole (PRILOSEC OTC) 20  MG tablet Take 1 tablet (20 mg total) by mouth daily. (Patient taking differently: Take 20 mg by mouth in the morning and at bedtime.)     No current facility-administered medications for this visit.    Allergies as of 03/26/2020 - Review Complete 03/26/2020  Allergen Reaction Noted  . Codeine Rash 04/22/2013  . Sulfa antibiotics Rash 04/22/2013    Family History  Problem Relation Age of Onset  . Colon cancer Neg Hx   . Inflammatory bowel disease Neg Hx   . Liver disease Neg Hx     Social History   Socioeconomic History  . Marital status: Married    Spouse name: Not on file  . Number of children: 2  . Years of education: Not on file  . Highest education level: Not on file  Occupational History  . Occupation: Clinical biochemist  Tobacco Use  . Smoking status: Never Smoker  . Smokeless tobacco: Current User    Types: Chew  . Tobacco comment: dips  Vaping Use  . Vaping Use: Never used  Substance and Sexual Activity  . Alcohol use: Not Currently    Comment: rarely  . Drug use: No  . Sexual activity: Not on file  Other Topics Concern  . Not on file  Social History Narrative  . Not on file   Social Determinants of Health   Financial Resource Strain: Not on file  Food Insecurity: Not on file  Transportation Needs: Not on file  Physical Activity: Not on file  Stress: Not on file  Social Connections: Not on file    Review of Systems: Gen: Denies fever, chills, lightheadedness, or dizziness.   CV: Denies chest pain or palpitations. Resp: Denies dyspnea or cough.  GI: See HPI Heme: See HPI  Physical Exam: BP 117/73   Pulse 67   Temp (!) 96.9 F (36.1 C)   Ht 6\' 1"  (1.854 m)   Wt 220 lb 6.4 oz (100 kg)   BMI 29.08 kg/m  General: Alert and oriented. No distress noted. Pleasant and cooperative.  Head:  Normocephalic and atraumatic. Eyes:  Conjuctiva clear without scleral icterus. Heart:  S1, S2 present without murmurs appreciated. Lungs:  Clear to auscultation  bilaterally. No wheezes, rales, or rhonchi. No distress.  Abdomen:  +BS, soft, non-tender and non-distended. No rebound or guarding. No HSM or masses noted. Msk:  Symmetrical without gross deformities. Normal posture. Extremities:  Without edema. Neurologic:  Alert and  oriented x4 Psych: Normal mood and affect.

## 2020-03-26 ENCOUNTER — Other Ambulatory Visit: Payer: Self-pay

## 2020-03-26 ENCOUNTER — Encounter: Payer: Self-pay | Admitting: Gastroenterology

## 2020-03-26 ENCOUNTER — Ambulatory Visit: Payer: 59 | Admitting: Gastroenterology

## 2020-03-26 VITALS — BP 117/73 | HR 67 | Temp 96.9°F | Ht 73.0 in | Wt 220.4 lb

## 2020-03-26 DIAGNOSIS — K649 Unspecified hemorrhoids: Secondary | ICD-10-CM | POA: Diagnosis not present

## 2020-03-26 DIAGNOSIS — K625 Hemorrhage of anus and rectum: Secondary | ICD-10-CM

## 2020-03-26 LAB — CBC WITH DIFFERENTIAL/PLATELET
Absolute Monocytes: 558 cells/uL (ref 200–950)
Basophils Absolute: 50 cells/uL (ref 0–200)
Basophils Relative: 0.8 %
Eosinophils Absolute: 316 cells/uL (ref 15–500)
Eosinophils Relative: 5.1 %
HCT: 37.1 % — ABNORMAL LOW (ref 38.5–50.0)
Hemoglobin: 12.2 g/dL — ABNORMAL LOW (ref 13.2–17.1)
Lymphs Abs: 2021 cells/uL (ref 850–3900)
MCH: 27.1 pg (ref 27.0–33.0)
MCHC: 32.9 g/dL (ref 32.0–36.0)
MCV: 82.3 fL (ref 80.0–100.0)
MPV: 10.6 fL (ref 7.5–12.5)
Monocytes Relative: 9 %
Neutro Abs: 3255 cells/uL (ref 1500–7800)
Neutrophils Relative %: 52.5 %
Platelets: 257 10*3/uL (ref 140–400)
RBC: 4.51 10*6/uL (ref 4.20–5.80)
RDW: 13.1 % (ref 11.0–15.0)
Total Lymphocyte: 32.6 %
WBC: 6.2 10*3/uL (ref 3.8–10.8)

## 2020-03-26 LAB — COMPLETE METABOLIC PANEL WITH GFR
AG Ratio: 1.8 (calc) (ref 1.0–2.5)
ALT: 17 U/L (ref 9–46)
AST: 12 U/L (ref 10–35)
Albumin: 4.5 g/dL (ref 3.6–5.1)
Alkaline phosphatase (APISO): 51 U/L (ref 35–144)
BUN: 12 mg/dL (ref 7–25)
CO2: 30 mmol/L (ref 20–32)
Calcium: 9.4 mg/dL (ref 8.6–10.3)
Chloride: 102 mmol/L (ref 98–110)
Creat: 1.08 mg/dL (ref 0.70–1.33)
GFR, Est African American: 90 mL/min/{1.73_m2} (ref >=60)
GFR, Est Non African American: 78 mL/min/{1.73_m2} (ref >=60)
Globulin: 2.5 g/dL (calc) (ref 1.9–3.7)
Glucose, Bld: 161 mg/dL — ABNORMAL HIGH (ref 65–99)
Potassium: 4.2 mmol/L (ref 3.5–5.3)
Sodium: 139 mmol/L (ref 135–146)
Total Bilirubin: 0.4 mg/dL (ref 0.2–1.2)
Total Protein: 7 g/dL (ref 6.1–8.1)

## 2020-03-26 LAB — PROTIME-INR
INR: 0.9
Prothrombin Time: 10.1 s (ref 9.0–11.5)

## 2020-03-26 NOTE — Patient Instructions (Signed)
Please follow-up labs completed at [facility].  We will arrange we have an ultrasound of your abdomen.  Start using Anusol cream twice daily per rectum to help with rectal bleeding.  If you have any profuse rectal bleeding or feel lightheaded, dizzy, or like you might pass out, you should proceed to the emergency room.  We will call you with your results and further recommendations.  It was good to see you today!   Aliene Altes, PA-C [facility]

## 2020-03-26 NOTE — Assessment & Plan Note (Addendum)
Nonbleeding rectal varices noted on recent colonoscopy in October 2021.  Patient has no known history of liver disease.  No signs or symptoms of decompensated liver disease.  Denies any significant history of alcohol use, drug use, family history of liver disease, or personal or family history of autoimmune conditions.  CT renal in August 2020 with evidence of fatty liver.  LFTs in 2020 within normal limits.  Platelets normal October 2021.  If patient has cirrhosis, suspect this is likely secondary to fatty liver.  We will start with CBC, CMP, INR, and ultrasound to evaluate for evidence of cirrhosis.  Further recommendations to follow.

## 2020-03-26 NOTE — Assessment & Plan Note (Addendum)
54 year old male with history of rectal bleeding that had resolved after hemorrhoid banding.  Recurrent rectal bleeding began around July 2021.  Recently underwent colonoscopy in October revealing large nonbleeding internal hemorrhoids, 4 mm tubular adenoma, normal TI, medium sized nonbleeding rectal varices.  Recommended repeat colonoscopy in 5 years.  Patient reports rectal bleeding had self resolved after colonoscopy, but returned around November 18 and has continued on and off daily to every other day.  Notes some tiredness but no lightheadedness or feeling like he would pass out.  Suspect rectal bleeding is likely secondary to hemorrhoids.  Due to rectal varices, hemorrhoid banding is contraindicated.  He has no known history of liver disease and no signs or symptoms of decompensation, but we are evaluating this further (addressed above under rectal varices).  Plan: Update CBC Start Anusol rectal cream twice daily. Advised to proceed to the emergency room if he has any profuse rectal bleeding or feeling that he would pass out. Hopeful that rectal creams will provide adequate management of his hemorrhoids.  We are limited in options due to varices.  Will discuss further with Dr. Gala Romney should symptoms not improve with Anusol.  Further recommendations to follow labs and ultrasound.

## 2020-03-29 ENCOUNTER — Telehealth: Payer: Self-pay | Admitting: Internal Medicine

## 2020-03-29 NOTE — Telephone Encounter (Signed)
I am planning to discuss with Dr. Gala Romney today to determine the next steps in management of hemorrhoids and rectal varices.   Elmo Putt, can you verify that patient is using Anusol rectal cream BID?   [facility] Clinical Pool: Any way to get his [place] moved up sooner?

## 2020-03-29 NOTE — Telephone Encounter (Signed)
Spoke with pt. Pt started Anusol Rectal Cream bid on Friday 03/26/20.

## 2020-03-29 NOTE — Telephone Encounter (Signed)
Called CS and U/S scheduled for 12/15 at 7:30am, arrival 7:15am, npo midnight. Called pt and he is aware of appt details.Marland Kitchen

## 2020-03-29 NOTE — Telephone Encounter (Signed)
Patient called with an update for kristen and said that he is that same as he was Friday, still bleeding,  [phone]

## 2020-03-29 NOTE — Telephone Encounter (Signed)
Tried to call patient on home number and cell number to see how he was feeling. Waiting to discuss with Dr. Gala Romney. We should be contacting him tomorrow with additional recommendations.

## 2020-03-29 NOTE — Progress Notes (Signed)
Cc'ed to pcp °

## 2020-03-31 ENCOUNTER — Other Ambulatory Visit: Payer: Self-pay

## 2020-03-31 ENCOUNTER — Ambulatory Visit (HOSPITAL_COMMUNITY)
Admission: RE | Admit: 2020-03-31 | Discharge: 2020-03-31 | Disposition: A | Discharge status: Home / Self Care | Payer: 59 | Source: Ambulatory Visit | Attending: Gastroenterology | Admitting: Gastroenterology

## 2020-03-31 DIAGNOSIS — K649 Unspecified hemorrhoids: Secondary | ICD-10-CM | POA: Diagnosis present

## 2020-04-01 ENCOUNTER — Telehealth: Payer: Self-pay | Admitting: Internal Medicine

## 2020-04-01 NOTE — Telephone Encounter (Signed)
Tried calling pt. Will call pt back.

## 2020-04-01 NOTE — Telephone Encounter (Signed)
Spoke with pt in concerns of rectal bleeding. Pt is improving daily. Pt states he noticed a little blood yesterday but felt it was great compared to how it was and today no blood has been seen.

## 2020-04-01 NOTE — Telephone Encounter (Signed)
Pt was returning a call to Aliene Altes, [place]. Please call [phone]

## 2020-04-01 NOTE — Telephone Encounter (Signed)
I am glad the rectal bleeding has tapered off. I recommend he continue he anusol rectal cream to complete a 10 day course.   I spoke with Dr. Gala Romney and Dr. Abbey Chatters. Due to rectal varices, we would not be able to band his hemorrhoids. We could refer him to a surgeon in [place] to discuss surgical management. I am not sure if they would be willing to perform hemorrhoidectomy. He would have to see them first. The other option is to try to manage with rectal creams and see how he does. If he continues to have frequent rectal bleeding, then refer to surgery at that time.   I would like to update his H/H one more time now that rectal bleeding has stopped. Dx: rectal bleeding. Please arrange.

## 2020-04-02 ENCOUNTER — Other Ambulatory Visit: Payer: Self-pay

## 2020-04-02 DIAGNOSIS — K625 Hemorrhage of anus and rectum: Secondary | ICD-10-CM

## 2020-04-02 DIAGNOSIS — K649 Unspecified hemorrhoids: Secondary | ICD-10-CM

## 2020-04-02 DIAGNOSIS — K760 Fatty (change of) liver, not elsewhere classified: Secondary | ICD-10-CM

## 2020-04-02 NOTE — Telephone Encounter (Signed)
Pt returned call. Pt notified of Kristen Harper's PA recommendations. Pt will continue rectal cream x 10 days and will get labs checked today. Lab orders placed and released for [facility].

## 2020-04-05 ENCOUNTER — Telehealth: Payer: Self-pay | Admitting: Internal Medicine

## 2020-04-05 NOTE — Telephone Encounter (Signed)
Spoke with patient's wife. She was asking if something may have been missed on his colonoscopy such as a distal colonic or rectal cancer. Explained that Dr. Abbey Chatters was able to visualize his entire colon/rectum and also performs a digital rectal exam prior to the colonoscopy. 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 She voiced understanding. States patient told her he hasn't had any rectal bleeding for a few days. Plans to have CBC completed this week.

## 2020-04-05 NOTE — Telephone Encounter (Signed)
Noted  

## 2020-04-05 NOTE — Telephone Encounter (Signed)
Pt's wife called asking to speak with Aliene Altes, PA regarding patient. [phone]

## 2020-04-06 ENCOUNTER — Ambulatory Visit (HOSPITAL_COMMUNITY): Payer: 59

## 2020-04-07 LAB — HEMOGLOBIN AND HEMATOCRIT, BLOOD
HCT: 38.5 % (ref 38.5–50.0)
Hemoglobin: 12.9 g/dL — ABNORMAL LOW (ref 13.2–17.1)

## 2020-04-08 ENCOUNTER — Other Ambulatory Visit: Payer: Self-pay | Admitting: Gastroenterology

## 2020-04-08 DIAGNOSIS — K625 Hemorrhage of anus and rectum: Secondary | ICD-10-CM

## 2020-04-12 ENCOUNTER — Other Ambulatory Visit: Payer: Self-pay

## 2020-04-12 ENCOUNTER — Ambulatory Visit (HOSPITAL_COMMUNITY)
Admission: RE | Admit: 2020-04-12 | Discharge: 2020-04-12 | Disposition: A | Discharge status: Home / Self Care | Payer: 59 | Source: Ambulatory Visit | Attending: Gastroenterology | Admitting: Gastroenterology

## 2020-04-12 DIAGNOSIS — K649 Unspecified hemorrhoids: Secondary | ICD-10-CM | POA: Diagnosis not present

## 2020-04-12 DIAGNOSIS — K760 Fatty (change of) liver, not elsewhere classified: Secondary | ICD-10-CM | POA: Diagnosis present

## 2020-04-15 ENCOUNTER — Telehealth: Payer: Self-pay | Admitting: Internal Medicine

## 2020-04-15 NOTE — Telephone Encounter (Signed)
Returned pt's call was advised he had not heard anything from [place] regarding his [place]. I advised him that Helmut Muster had spoke to his wife and even detailed the instructions to her regarding the report. I went back over the entire list of results and Dr's instructions for the pt and advised him he would be receiving some paperwork in the mail and to read it. Advised the pt to follow the Dr's instructions because this is what is recommended for now. He didn't agree or disagree.

## 2020-04-15 NOTE — Telephone Encounter (Signed)
Pt called asking if his [place] results from 12/27 were back yet and what was his next course of action. Please advise. [phone]

## 2020-04-28 ENCOUNTER — Telehealth: Payer: Self-pay | Admitting: Gastroenterology

## 2020-04-28 NOTE — Telephone Encounter (Signed)
Juan Griffith, please let Juan Griffith know I have reviewed his case with Dr. Gala Romney.  Dr. Gala Romney has reviewed labs, imaging studies,and colonoscopy performed by Dr. Abbey Chatters.  Dr. Gala Romney agrees that Juan Griffith has evidence of fatty liver, but does not have cirrhosis.  Also states based on pictures taken during colonoscopy, he does not think Juan Griffith has rectal varices. Recommended following the rectal bleeding clinically. When I last spoke with Juan Griffith's wife on 12/20, Juan Griffith had reported resolution of rectal bleeding. Lets plan to follow-up in 3 months. He can continue to use Anusol rectal cream twice daily as needed for rectal bleeding 10-14 days at a time. Please call with questions or concerns prior to next visit.

## 2020-05-04 NOTE — Telephone Encounter (Signed)
Information has been relayed to patient.  He continues to do well.  About 10 days ago, he had 2 days of rectal bleeding, but this has resolved. Did not use Anusol cream at that time. He is feeling much better.  Energy level has improved.  He will continue to use Anusol cream as needed, and we will follow-up in 3 months.  Advised to call with any questions or concerns prior to his next office visit.  Juan Griffith: Please arrange follow-up in 3 months. Dx: Rectal bleeding

## 2020-05-05 ENCOUNTER — Encounter: Payer: Self-pay | Admitting: Internal Medicine

## 2020-05-05 NOTE — Telephone Encounter (Signed)
Noted  

## 2020-05-05 NOTE — Telephone Encounter (Signed)
Patient scheduled.

## 2020-06-28 ENCOUNTER — Other Ambulatory Visit: Payer: Self-pay | Admitting: Gastroenterology

## 2020-06-28 ENCOUNTER — Other Ambulatory Visit: Payer: Self-pay

## 2020-06-28 DIAGNOSIS — K625 Hemorrhage of anus and rectum: Secondary | ICD-10-CM

## 2020-06-28 LAB — CBC WITH DIFFERENTIAL/PLATELET
Absolute Monocytes: 660 cells/uL (ref 200–950)
Basophils Absolute: 42 cells/uL (ref 0–200)
Basophils Relative: 0.7 %
Eosinophils Absolute: 312 cells/uL (ref 15–500)
Eosinophils Relative: 5.2 %
HCT: 38.7 % (ref 38.5–50.0)
Hemoglobin: 12.6 g/dL — ABNORMAL LOW (ref 13.2–17.1)
Lymphs Abs: 2136 cells/uL (ref 850–3900)
MCH: 26.9 pg — ABNORMAL LOW (ref 27.0–33.0)
MCHC: 32.6 g/dL (ref 32.0–36.0)
MCV: 82.7 fL (ref 80.0–100.0)
MPV: 10.4 fL (ref 7.5–12.5)
Monocytes Relative: 11 %
Neutro Abs: 2850 cells/uL (ref 1500–7800)
Neutrophils Relative %: 47.5 %
Platelets: 271 10*3/uL (ref 140–400)
RBC: 4.68 10*6/uL (ref 4.20–5.80)
RDW: 14.3 % (ref 11.0–15.0)
Total Lymphocyte: 35.6 %
WBC: 6 10*3/uL (ref 3.8–10.8)

## 2020-06-28 MED ORDER — HYDROCORTISONE (PERIANAL) 2.5 % EX CREA: 1.0000 "application " | TOPICAL_CREAM | Freq: Two times a day (BID) | CUTANEOUS | 0 refills | Status: DC

## 2020-06-28 NOTE — Progress Notes (Signed)
Pt walked in office stating he has had rectal bleeding for 1 week. Bleeding is worse in the mornings. Pt isn't having any lightheadedness, dizziness ect. Pt advised to go to the ED if his symptoms worsen or he becomes lightheaded ect. Pt advised to continue Anusol cream per Aliene Altes, PA. Pt is scheduled for an ov this Wednesday 06/30/2020 with Aliene Altes, PA.

## 2020-06-28 NOTE — Progress Notes (Signed)
Referring Provider: Frances Maywood, FNP Primary Care Physician:  Frances Maywood, FNP Primary GI Physician: Dr. Gala Romney  Chief Complaint  Patient presents with  . Rectal Bleeding    For past week daily, early am starts out dark. Happens every time has BM    HPI:   Juan Griffith is a 55 y.o. male presenting today with a history of GERD, erosive/ulcerative reflux esophagitis and Schatzki's ring s/p dilation 2009, rectal bleeding that previously responded well to hemorrhoid banding x3 in 2015.  Recently underwent colonoscopy in October 2021 with Dr. Abbey Chatters due to recurrent rectal bleeding which revealed large nonbleeding internal hemorrhoids, and small tubular adenoma, and queried medium sized nonbleeding rectal varices.  Recommended repeat colonoscopy in 5 years.  Later underwent abdominal ultrasound to evaluate for possible cirrhosis in light of ?  Rectal varices which revealed fatty liver.  Elastography with kPa 2.6 suggesting high probability of being normal.  Labs were also dated in December 2021.  LFTs within normal limits.  Platelets normal.  INR normal.  I reviewed patient case with Dr. Gala Romney who also reviewed labs, imaging, and colonoscopy.  Agrees that patient has evidence of fatty liver, but does not have cirrhosis.  He also did not think patient had rectal varices.  Recommended following rectal bleeding clinically.   Patient called our office 06/28/2020 reporting rectal bleeding x1 week with no alarm symptoms.  He was advised to resume Anusol cream twice daily, complete CBC, and he was scheduled for an office visit on 3/16.  CBC revealed hemoglobin low but stable at 12.6.    Today: Anusol has helped previously but doesn't seem to be helping with rectal bleeding this time. Rectal bleeding started about 7 days ago and occurs with every BM. Using Anusol BID x7 days. Blood is bright red and can be a little darker but no black stools. No constipation, straining, or hard stools. No  diarrhea. No abdominal pain. Mild rectal discomfort secondary to wiping so much with bleeding. No sharp rectal pain.   Feels tired but no lightheadedness or dizziness. He is very stressed and worried about ongoing rectal bleeding.   No upper GI symptoms. GERD is well controlled on omeprazole BID.   Past Medical History:  Diagnosis Date  . Diverticulosis   . GERD (gastroesophageal reflux disease)   . Hemorrhoids    s/p banding x1 in 2015 and banding x2 in 2017  . History of kidney stones   . Hyperplastic colon polyp   . Schatzki's ring    s/p dilation 2009  . Sigmoid diverticulitis     Past Surgical History:  Procedure Laterality Date  . COLONOSCOPY  12/30/2007   GHW:EXHBZJ rectum aside from minimal anal canal hemorrhoids/Extensive left-sided diverticulum.  Colonic mucosa appeared normal  . COLONOSCOPY N/A 04/25/2013   Dr. Gala Romney- engorged internal hemorrhoids- likely source of hematochezia. diminutive rectosigmoid polyp. L sided diverticulosis and sigmoid diverticulitis, hyperplastic polyp.  Due for repeat in 2025.  Marland Kitchen COLONOSCOPY WITH PROPOFOL N/A 02/06/2020   Surgeon: Eloise Harman, DO;  arge nonbleeding internal hemorrhoids, small tubular adenoma, and queried medium sized nonbleeding rectal varices.   . ESOPHAGOGASTRODUODENOSCOPY  12/30/2007   IRC:VELFYBOF ring with superimposed distal esophageal erosions/superficial ulcerations consistent with erosive/ulcerative reflux esophagitis status post dilation of Schatzki ring /small HH  . HEMORRHOID BANDING     Dr. Gala Romney; x1 in 2015 and x2 in 2017  . NOSE SURGERY  1985  . POLYPECTOMY  02/06/2020   Procedure: POLYPECTOMY;  Surgeon: Eloise Harman, DO;  Location: [facility];  Service: Endoscopy;;  . ROTATOR CUFF REPAIR Right     Current Outpatient Medications  Medication Sig Dispense Refill  . hydrocortisone (ANUSOL-HC) 2.5 % rectal cream Place 1 application rectally 2 (two) times daily. 30 g 0  . omeprazole (PRILOSEC OTC) 20  MG tablet Take 1 tablet (20 mg total) by mouth daily. (Patient taking differently: Take 20 mg by mouth in the morning and at bedtime.)     No current facility-administered medications for this visit.    Allergies as of 06/30/2020 - Review Complete 06/30/2020  Allergen Reaction Noted  . Codeine Rash 04/22/2013  . Sulfa antibiotics Rash 04/22/2013    Family History  Problem Relation Age of Onset  . Colon cancer Neg Hx   . Inflammatory bowel disease Neg Hx   . Liver disease Neg Hx     Social History   Socioeconomic History  . Marital status: Married    Spouse name: Not on file  . Number of children: 2  . Years of education: Not on file  . Highest education level: Not on file  Occupational History  . Occupation: Clinical biochemist  Tobacco Use  . Smoking status: Never Smoker  . Smokeless tobacco: Current User    Types: Chew  . Tobacco comment: dips  Vaping Use  . Vaping Use: Never used  Substance and Sexual Activity  . Alcohol use: Not Currently    Comment: rarely  . Drug use: No  . Sexual activity: Not on file  Other Topics Concern  . Not on file  Social History Narrative  . Not on file   Social Determinants of Health   Financial Resource Strain: Not on file  Food Insecurity: Not on file  Transportation Needs: Not on file  Physical Activity: Not on file  Stress: Not on file  Social Connections: Not on file    Review of Systems: Gen: Denies fever, chills, cold-like symptoms. CV: Denies chest pain or palpitations. Resp: Denies dyspnea or cough. GI: See HPI Heme: See HPI  Physical Exam: BP 114/73   Pulse 67   Temp (!) 97.3 F (36.3 C)   Ht 6\' 1"  (1.854 m)   Wt 224 lb 9.6 oz (101.9 kg)   BMI 29.63 kg/m  General:   Alert and oriented. No distress noted. Pleasant and cooperative.  Head:  Normocephalic and atraumatic. Eyes:  Conjuctiva clear without scleral icterus. Heart:  S1, S2 present without murmurs appreciated. Lungs:  Clear to auscultation bilaterally.  No wheezes, rales, or rhonchi. No distress.  Abdomen:  +BS, soft, non-tender and non-distended. No rebound or guarding. No HSM or masses noted. Msk:  Symmetrical without gross deformities. Normal posture. Extremities:  Without edema. Neurologic:  Alert and  oriented x4 Psych:  Normal mood and affect.   Assessment: 55 year old male presenting today for further evaluation of rectal bleeding.  History of rectal bleeding s/p hemorrhoid banding x3 in 2015 with good clinical response.  Had been doing very well until June/July 2021 when hematochezia returned.  Underwent colonoscopy October 2021 with Dr. Abbey Chatters due to requesting ASAP procedure which revealed large nonbleeding internal hemorrhoids, small tubular adenoma, and queried medium sized nonbleeding rectal varices.  He underwent evaluation for possible cirrhosis which was negative, evidence of fatty liver.  Upon further discussion with Dr. Gala Romney, he did not think patient had rectal varices based off of images on colonoscopy report.  Recommended following clinically.  He has continued with intermittent rectal bleeding  that has responded fairly well to Anusol rectal cream, but over the last week, symptoms are not improving.  Now with bright red blood per rectum with every BM for the last 7 days.  Hemoglobin slightly low, but stable at 12.6 on 3/14. MCH has been declining slowly.  Denies melena, abdominal pain, lightheadedness/dizziness, or any significant upper GI symptoms.  Suspect we are likely dealing with ongoing hemorrhoid bleeding.    Unable to do hemorrhoid banding in office due to question of rectal varices.  I will discuss with Dr. Gala Romney possibility of flexible sigmoidoscopy with hemorrhoid banding at that time.  We will hope to get the procedure scheduled ASAP. Will also update labs and evaluate for IDA in light of ongoing blood loss.   Plan:  1.  Increase Anusol rectal cream to 4 times daily. 2.  Repeat CBC and iron panel on Monday,  07/05/2020. 3.  Discussed with Dr. Gala Romney possibility of flexible sigmoidoscopy with hemorrhoid banding. 4.  Advised to proceed to emergency room if he feels lightheaded, dizzy, or like she will pass out.   Aliene Altes, PA-C [facility]

## 2020-06-30 ENCOUNTER — Encounter: Payer: Self-pay | Admitting: Gastroenterology

## 2020-06-30 ENCOUNTER — Ambulatory Visit: Payer: 59 | Admitting: Gastroenterology

## 2020-06-30 ENCOUNTER — Other Ambulatory Visit: Payer: Self-pay

## 2020-06-30 VITALS — BP 114/73 | HR 67 | Temp 97.3°F | Ht 73.0 in | Wt 224.6 lb

## 2020-06-30 DIAGNOSIS — D649 Anemia, unspecified: Secondary | ICD-10-CM | POA: Diagnosis not present

## 2020-06-30 DIAGNOSIS — K625 Hemorrhage of anus and rectum: Secondary | ICD-10-CM

## 2020-06-30 NOTE — Progress Notes (Signed)
CC'ED TO PCP 

## 2020-06-30 NOTE — Patient Instructions (Signed)
Increase Anusol rectal cream to 4 times daily.   Have blood work repeated on Monday 3/21.  I am going to talk with Dr. Gala Romney about gettting you scheduled for a flexible sigmoidoscopy with possible hemorrhoid banding ASAP. You should hear from our office before the end of the week.   If you have any lightheadedness, dizziness, or feel you will pass out, you should proceed to the emergency room.   Aliene Altes, PA-C [facility] Gastroenter[facility]

## 2020-07-01 ENCOUNTER — Telehealth: Payer: Self-pay | Admitting: Gastroenterology

## 2020-07-01 NOTE — Telephone Encounter (Signed)
Called patient and he is scheduled for a banding

## 2020-07-01 NOTE — Telephone Encounter (Signed)
Please let patient know I have discussed his case with Dr. Gala Romney. Dr. Gala Romney has recommended hemorrhoid banding in office. States patient doesn't have rectal varices and he is fine to have banding in office. We will set him up to see Roseanne Kaufman, NP for banding.   Stacey: Please arrange OV for hemorrhoid banding with Vicente Males ASAP.

## 2020-07-02 NOTE — Telephone Encounter (Signed)
Noted. Pt was scheduled for banding with Roseanne Kaufman, NP.

## 2020-07-04 ENCOUNTER — Telehealth: Payer: Self-pay | Admitting: Internal Medicine

## 2020-07-04 NOTE — Telephone Encounter (Signed)
I attempted to call patient at both numbers in the chart.  Could not reach or leave a voicemail.  I reviewed the record and discussed with [employer].  Patient likely has recurrent hemorrhoidal bleeding.  I do not think patient has rectal varices or portal hypertension or any significant degree of chronic liver disease.  It appears that he had a very good result from hemorrhoid banding for almost 7 years-originally done back in 2015.  Recent colonoscopy otherwise reassuring.  I recommend we offer the patient another round of hemorrhoid banding (likely 1 band per session) to see if we cannot get his bleeding resolved.  He tolerated it well before with a good result.  As we have seen, banding may not be a permanent fix.  But it sounds like he had 7 years remission from hemorrhoid symptoms.  Moreover, if he went for surgical hemorrhoidectomy, that too, would not be done with 100% guaranteed cure.  I would offer him hemorrhoid banding in the office as next step.  Cyril Mourning, can you reach out to Juan Griffith and convey to him I think it would be worthwhile to proceed with banding again over sending him to Dr. Arnoldo Morale.  It is certainly possible he may end up seeing Arnoldo Morale for formal hemorrhoidectomy at some point.  But, as stated, either way no guaranteed permit cure. I' favor the easier rout first  If he is agreeable to another banding session, set him up with me.  Thanks.

## 2020-07-04 NOTE — Telephone Encounter (Signed)
Dr. Gala Romney, patient is already scheduled for a banding with Roseanne Kaufman, NP on 4/27. See telephone note dated 3/17. Do you want this appointment canceled and patient to be scheduled with you?

## 2020-07-05 NOTE — Telephone Encounter (Signed)
Noted  

## 2020-07-05 NOTE — Telephone Encounter (Signed)
NotedErline Griffith, please leave patient's current appointment with Vicente Males for hemorrhoid banding in place.

## 2020-07-05 NOTE — Telephone Encounter (Signed)
Sounds good. That was the initial plan.  Glad he's already set up.

## 2020-07-06 ENCOUNTER — Other Ambulatory Visit: Payer: Self-pay

## 2020-07-06 DIAGNOSIS — D509 Iron deficiency anemia, unspecified: Secondary | ICD-10-CM

## 2020-07-06 DIAGNOSIS — K625 Hemorrhage of anus and rectum: Secondary | ICD-10-CM

## 2020-07-06 LAB — CBC WITH DIFFERENTIAL/PLATELET
Absolute Monocytes: 675 cells/uL (ref 200–950)
Basophils Absolute: 50 cells/uL (ref 0–200)
Basophils Relative: 0.7 %
Eosinophils Absolute: 284 cells/uL (ref 15–500)
Eosinophils Relative: 4 %
HCT: 38.7 % (ref 38.5–50.0)
Hemoglobin: 12.8 g/dL — ABNORMAL LOW (ref 13.2–17.1)
Lymphs Abs: 2762 cells/uL (ref 850–3900)
MCH: 27 pg (ref 27.0–33.0)
MCHC: 33.1 g/dL (ref 32.0–36.0)
MCV: 81.6 fL (ref 80.0–100.0)
MPV: 10.1 fL (ref 7.5–12.5)
Monocytes Relative: 9.5 %
Neutro Abs: 3330 cells/uL (ref 1500–7800)
Neutrophils Relative %: 46.9 %
Platelets: 256 10*3/uL (ref 140–400)
RBC: 4.74 10*6/uL (ref 4.20–5.80)
RDW: 14.3 % (ref 11.0–15.0)
Total Lymphocyte: 38.9 %
WBC: 7.1 10*3/uL (ref 3.8–10.8)

## 2020-07-06 LAB — IRON,TIBC AND FERRITIN PANEL
%SAT: 8 % (calc) — ABNORMAL LOW (ref 20–48)
Ferritin: 7 ng/mL — ABNORMAL LOW (ref 38–380)
Iron: 32 ug/dL — ABNORMAL LOW (ref 50–180)
TIBC: 411 mcg/dL (calc) (ref 250–425)

## 2020-07-08 ENCOUNTER — Other Ambulatory Visit: Payer: Self-pay | Admitting: Gastroenterology

## 2020-07-08 DIAGNOSIS — K625 Hemorrhage of anus and rectum: Secondary | ICD-10-CM

## 2020-07-27 ENCOUNTER — Other Ambulatory Visit: Payer: Self-pay

## 2020-07-27 DIAGNOSIS — K625 Hemorrhage of anus and rectum: Secondary | ICD-10-CM

## 2020-07-27 DIAGNOSIS — D509 Iron deficiency anemia, unspecified: Secondary | ICD-10-CM

## 2020-08-04 ENCOUNTER — Ambulatory Visit: Payer: 59 | Admitting: Gastroenterology

## 2020-08-09 ENCOUNTER — Telehealth: Payer: Self-pay | Admitting: Internal Medicine

## 2020-08-09 NOTE — Telephone Encounter (Signed)
Tried calling pt. VM is full. Will call pt back.  

## 2020-08-09 NOTE — Telephone Encounter (Signed)
PLEASE CALL PATIENT ABOUT THE CREAM HE USES WHEN HER HAS RECTAL BLEEDING  [phone]

## 2020-08-11 ENCOUNTER — Encounter: Payer: Self-pay | Admitting: Gastroenterology

## 2020-08-11 ENCOUNTER — Ambulatory Visit: Payer: 59 | Admitting: Gastroenterology

## 2020-08-11 ENCOUNTER — Other Ambulatory Visit: Payer: Self-pay

## 2020-08-11 VITALS — BP 113/69 | HR 70 | Temp 97.3°F | Ht 73.0 in | Wt 218.4 lb

## 2020-08-11 DIAGNOSIS — K642 Third degree hemorrhoids: Secondary | ICD-10-CM | POA: Insufficient documentation

## 2020-08-11 NOTE — Telephone Encounter (Signed)
Called pt/ no VM. Not able to leave a message.

## 2020-08-11 NOTE — Progress Notes (Signed)
[place] Banding Note:   Pleasant 55 year old male presenting today with Grade 2-3 internal hemorrhoids, recent colonoscopy Oct 2021: non-bleeding internal hemorrhoids, one 4 mm polyp in sigmoid, concern for rectal varices but after review is not consistent with such. US abdomen and elastography on file with fatty liver but no advanced fibrosis/cirrhosis. No stigmata of portal hypertension. No contraindication for banding. Notes bleeding, leaking, and soiling.    The patient presents with symptomatic grade 3 hemorrhoids, unresponsive to maximal medical therapy, requesting rubber band ligation of hishemorrhoidal disease. All risks, benefits, and alternative forms of therapy were described and informed consent was obtained.  The decision was made to band the left lateral internal hemorrhoid, and the [place] was used to perform band ligation without complication. Digital anorectal examination was then performed to assure proper positioning of the band, and to adjust the banded tissue as required. The patient was discharged home without pain or other issues. Dietary and behavioral recommendations were given and (if necessary prescriptions were given), along with follow-up instructions. The patient will return in several weeks for followup and possible additional banding as required.  No complications were encountered and the patient tolerated the procedure well.  Annitta Needs, PhD, ANP-BC [facility]enter[facility]

## 2020-08-11 NOTE — Telephone Encounter (Signed)
Pt received hemorrhoid banding this morning and is ok.

## 2020-08-11 NOTE — Patient Instructions (Signed)
We will see you back in the near future for repeat banding.  You can continue the cream as needed!  It was a pleasure to see you today. I want to create trusting relationships with patients to provide genuine, compassionate, and quality care. I value your feedback. If you receive a survey regarding your visit,  I greatly appreciate you taking time to fill this out.   Annitta Needs, PhD, ANP-BC [facility]astroenter[facility]

## 2020-09-20 ENCOUNTER — Telehealth: Payer: Self-pay | Admitting: Internal Medicine

## 2020-09-20 NOTE — Telephone Encounter (Signed)
Patient came to office window saying he was out of his prescription cream and could we send in another refill to [facility].

## 2020-09-21 ENCOUNTER — Other Ambulatory Visit: Payer: Self-pay | Admitting: Gastroenterology

## 2020-09-21 DIAGNOSIS — K625 Hemorrhage of anus and rectum: Secondary | ICD-10-CM

## 2020-09-21 LAB — IRON,TIBC AND FERRITIN PANEL
%SAT: 17 % (calc) — ABNORMAL LOW (ref 20–48)
Ferritin: 9 ng/mL — ABNORMAL LOW (ref 38–380)
Iron: 68 ug/dL (ref 50–180)
TIBC: 403 mcg/dL (calc) (ref 250–425)

## 2020-09-21 MED ORDER — HYDROCORTISONE 2.5 % EX CREA: TOPICAL_CREAM | CUTANEOUS | 1 refills | Status: DC

## 2020-09-21 NOTE — Telephone Encounter (Signed)
Rx sent 

## 2020-09-21 NOTE — Telephone Encounter (Signed)
Spoke to pt.  He needs refill on hydrocortisone cream.

## 2020-09-22 NOTE — Telephone Encounter (Signed)
Pt made aware that Rx has been sent.

## 2020-09-27 ENCOUNTER — Other Ambulatory Visit: Payer: Self-pay | Admitting: *Deleted

## 2020-09-27 ENCOUNTER — Telehealth: Payer: Self-pay | Admitting: *Deleted

## 2020-09-27 DIAGNOSIS — D509 Iron deficiency anemia, unspecified: Secondary | ICD-10-CM

## 2020-09-27 NOTE — Telephone Encounter (Signed)
Noted  

## 2020-09-27 NOTE — Telephone Encounter (Signed)
Spoke to pt.  Informed him that Iron panel is improving, but iron stores and saturation remain low. Advised him to continue iron daily. Informed him that we need to repeat iron panel and CBC in 12 weeks. Pt voiced understanding and confirmed his appt for 09/30/2020.

## 2020-09-27 NOTE — Telephone Encounter (Signed)
Tried to reach pt to give him results.  VM not set up on either phone.

## 2020-09-30 ENCOUNTER — Other Ambulatory Visit: Payer: Self-pay

## 2020-09-30 ENCOUNTER — Encounter: Payer: Self-pay | Admitting: Gastroenterology

## 2020-09-30 ENCOUNTER — Ambulatory Visit: Payer: 59 | Admitting: Gastroenterology

## 2020-09-30 VITALS — BP 116/73 | HR 74 | Temp 96.9°F | Ht 73.0 in | Wt 219.2 lb

## 2020-09-30 DIAGNOSIS — K641 Second degree hemorrhoids: Secondary | ICD-10-CM | POA: Insufficient documentation

## 2020-09-30 NOTE — Progress Notes (Signed)
[place] Banding Note:   Pleasant 55 year old male presenting today with Grade 2-3 internal hemorrhoids, recent colonoscopy Oct 2021: non-bleeding internal hemorrhoids, one 4 mm polyp in sigmoid, concern for rectal varices but after review is not consistent with such. US abdomen and elastography on file with fatty liver but no advanced fibrosis/cirrhosis. No stigmata of portal hypertension. No contraindication for banding. Notes bleeding, leaking, and soiling. He has had left lateral banding done in April 2022. Continues with rectal bleeding.   The patient presents with symptomatic grade 2 hemorrhoids, unresponsive to maximal medical therapy, requesting rubber band ligation of his hemorrhoidal disease. All risks, benefits, and alternative forms of therapy were described and informed consent was obtained.   The decision was made to band the right posterior internal hemorrhoid, and the [place] was used to perform band ligation without complication. Digital anorectal examination was then performed to assure proper positioning of the band, and to adjust the banded tissue as required. The patient was discharged home without pain or other issues. Dietary and behavioral recommendations were given and (if necessary prescriptions were given), along with follow-up instructions. The patient will return in several weeks for followup and possible additional banding as required.  No complications were encountered and the patient tolerated the procedure well  Annitta Needs, PhD, [facility]

## 2020-09-30 NOTE — Patient Instructions (Signed)
Continue to avoid straining, constipation, and make sure to limit toilet time.   I will see you back June 29th at [place]!  I enjoyed seeing you again today! As you know, I value our relationship and want to provide genuine, compassionate, and quality care. I welcome your feedback. If you receive a survey regarding your visit,  I greatly appreciate you taking time to fill this out. See you next time!  Annitta Needs, PhD, ANP-BC [facility] Gastroenterology

## 2020-10-13 ENCOUNTER — Ambulatory Visit (INDEPENDENT_AMBULATORY_CARE_PROVIDER_SITE_OTHER): Payer: 59 | Admitting: Gastroenterology

## 2020-10-13 ENCOUNTER — Encounter: Payer: Self-pay | Admitting: Gastroenterology

## 2020-10-13 ENCOUNTER — Other Ambulatory Visit: Payer: Self-pay

## 2020-10-13 VITALS — BP 118/73 | HR 59 | Temp 97.3°F | Ht 73.0 in | Wt 219.2 lb

## 2020-10-13 DIAGNOSIS — K641 Second degree hemorrhoids: Secondary | ICD-10-CM | POA: Diagnosis not present

## 2020-10-13 NOTE — Patient Instructions (Signed)
Please call with any pain!  We will see you back in 1 year.  You will receive lab orders in the mail to complete in a few months.  You can use over-the-counter lidocaine today and tomorrow if needed per rectum, but try to avoid if not needed.    I enjoyed seeing you again today! As you know, I value our relationship and want to provide genuine, compassionate, and quality care. I welcome your feedback. If you receive a survey regarding your visit,  I greatly appreciate you taking time to fill this out. See you next time!  Annitta Needs, PhD, ANP-BC [facility]enter[facility]

## 2020-10-13 NOTE — Progress Notes (Signed)
[place] Banding Note   Pleasant 55 year old male presenting today with Grade 2-3 internal hemorrhoids, recent colonoscopy Oct 2021: non-bleeding internal hemorrhoids, one 4 mm polyp in sigmoid, concern for rectal varices but after review is not consistent with such. US abdomen and elastography on file with fatty liver but no advanced fibrosis/cirrhosis. No stigmata of portal hypertension. No contraindication for banding. He has had banding of left lateral, right posterior thus far. Returns today with marked improvement and no rectal bleeding recently.    The patient presents with symptomatic grade 2 hemorrhoids, unresponsive to maximal medical therapy, requesting rubber band ligation of his hemorrhoidal disease. All risks, benefits, and alternative forms of therapy were described and informed consent was obtained.  The decision was made to band the right anterior internal hemorrhoid, and the [place] was used to perform band ligation without complication. Digital anorectal examination was then performed to assure proper positioning of the band, and to adjust the banded tissue as required. The patient was discharged home without pain or other issues. Dietary and behavioral recommendations were given and (if necessary prescriptions were given), along with follow-up instructions. The patient will return in 1 year for routine follow-up. Labs pending in several months to follow-up IDA, which will be mailed to him.   No complications were encountered and the patient tolerated the procedure well.  Annitta Needs, PhD, ANP-BC [facility] Gastroenterology

## 2020-10-14 ENCOUNTER — Ambulatory Visit: Payer: 59 | Admitting: Gastroenterology

## 2020-11-23 ENCOUNTER — Other Ambulatory Visit: Payer: Self-pay

## 2020-11-23 DIAGNOSIS — D509 Iron deficiency anemia, unspecified: Secondary | ICD-10-CM

## 2020-12-09 LAB — CBC WITH DIFFERENTIAL/PLATELET
Absolute Monocytes: 561 cells/uL (ref 200–950)
Basophils Absolute: 31 cells/uL (ref 0–200)
Basophils Relative: 0.5 %
Eosinophils Absolute: 232 cells/uL (ref 15–500)
Eosinophils Relative: 3.8 %
HCT: 39.6 % (ref 38.5–50.0)
Hemoglobin: 13 g/dL — ABNORMAL LOW (ref 13.2–17.1)
Lymphs Abs: 1885 cells/uL (ref 850–3900)
MCH: 27.5 pg (ref 27.0–33.0)
MCHC: 32.8 g/dL (ref 32.0–36.0)
MCV: 83.9 fL (ref 80.0–100.0)
MPV: 10.5 fL (ref 7.5–12.5)
Monocytes Relative: 9.2 %
Neutro Abs: 3392 cells/uL (ref 1500–7800)
Neutrophils Relative %: 55.6 %
Platelets: 237 10*3/uL (ref 140–400)
RBC: 4.72 10*6/uL (ref 4.20–5.80)
RDW: 14 % (ref 11.0–15.0)
Total Lymphocyte: 30.9 %
WBC: 6.1 10*3/uL (ref 3.8–10.8)

## 2020-12-09 LAB — IRON,TIBC AND FERRITIN PANEL
%SAT: 16 % (calc) — ABNORMAL LOW (ref 20–48)
Ferritin: 13 ng/mL — ABNORMAL LOW (ref 38–380)
Iron: 60 ug/dL (ref 50–180)
TIBC: 370 mcg/dL (calc) (ref 250–425)

## 2020-12-10 ENCOUNTER — Other Ambulatory Visit: Payer: Self-pay | Admitting: Gastroenterology

## 2020-12-10 DIAGNOSIS — D509 Iron deficiency anemia, unspecified: Secondary | ICD-10-CM

## 2021-06-21 ENCOUNTER — Other Ambulatory Visit: Payer: Self-pay | Admitting: Orthopedic Surgery

## 2021-06-21 DIAGNOSIS — M75100 Unspecified rotator cuff tear or rupture of unspecified shoulder, not specified as traumatic: Secondary | ICD-10-CM

## 2021-06-21 DIAGNOSIS — M6740 Ganglion, unspecified site: Secondary | ICD-10-CM

## 2021-07-21 ENCOUNTER — Ambulatory Visit
Admission: RE | Admit: 2021-07-21 | Discharge: 2021-07-21 | Disposition: A | Discharge status: Home / Self Care | Payer: 59 | Source: Ambulatory Visit | Attending: Orthopedic Surgery | Admitting: Orthopedic Surgery

## 2021-07-21 DIAGNOSIS — M75100 Unspecified rotator cuff tear or rupture of unspecified shoulder, not specified as traumatic: Secondary | ICD-10-CM

## 2021-07-21 DIAGNOSIS — M6740 Ganglion, unspecified site: Secondary | ICD-10-CM

## 2021-08-10 ENCOUNTER — Other Ambulatory Visit: Payer: Self-pay | Admitting: Orthopedic Surgery

## 2021-08-10 DIAGNOSIS — M25511 Pain in right shoulder: Secondary | ICD-10-CM

## 2021-08-11 ENCOUNTER — Other Ambulatory Visit: Payer: 59

## 2021-08-11 ENCOUNTER — Ambulatory Visit
Admission: RE | Admit: 2021-08-11 | Discharge: 2021-08-11 | Disposition: A | Discharge status: Home / Self Care | Payer: 59 | Source: Ambulatory Visit | Attending: Orthopedic Surgery | Admitting: Orthopedic Surgery

## 2021-08-11 DIAGNOSIS — M25511 Pain in right shoulder: Secondary | ICD-10-CM

## 2021-09-14 ENCOUNTER — Encounter: Payer: Self-pay | Admitting: Internal Medicine

## 2021-10-31 ENCOUNTER — Other Ambulatory Visit: Payer: Self-pay | Admitting: Orthopedic Surgery

## 2021-10-31 DIAGNOSIS — M25512 Pain in left shoulder: Secondary | ICD-10-CM

## 2021-11-10 ENCOUNTER — Ambulatory Visit
Admission: RE | Admit: 2021-11-10 | Discharge: 2021-11-10 | Disposition: A | Discharge status: Home / Self Care | Payer: 59 | Source: Ambulatory Visit | Attending: Orthopedic Surgery | Admitting: Orthopedic Surgery

## 2021-11-10 DIAGNOSIS — M25512 Pain in left shoulder: Secondary | ICD-10-CM

## 2022-01-15 IMAGING — US US LIVER ELASTOGRAPHY
1 series · 12 of 25 positions shown · non-contrast
Comparison: Abdominal ultrasound dated 03/31/2020

CLINICAL DATA: Fatty liver

EXAM:
US LIVER ELASTOGRAPHY
TECHNIQUE: Sonography of the liver was performed. In addition, ultrasound
elastography evaluation of the liver was performed. A region of
interest was placed within the right lobe of the liver. Following
application of a compressive sonographic pulse, tissue
compressibility was assessed. Multiple assessments were performed at
the selected site. Median tissue compressibility was determined.
Previously, hepatic stiffness was assessed by shear wave velocity.
Based on recently published [employer] in [employer]
consensus article, reporting is now recommended to be performed in
the SI units of pressure (kiloPascals) representing hepatic
stiffness/elasticity. The obtained result is compared to the
published reference standards. (cACLD = compensated Advanced Chronic
Liver Disease)

[Series 1: us elastography liver · 12 of 31 slices shown]
[im 2/31]
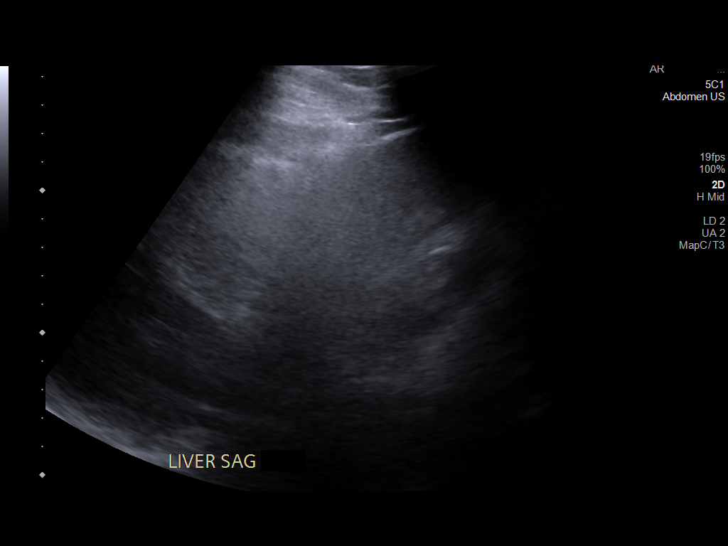
[im 4/31]
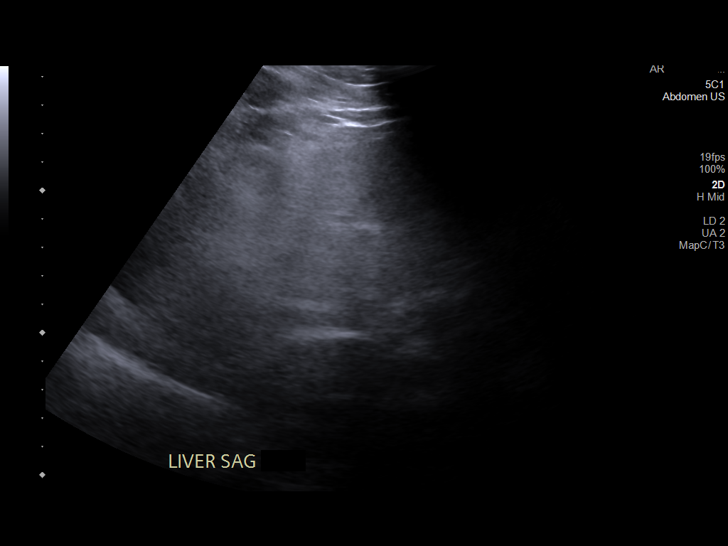
[im 7/31]
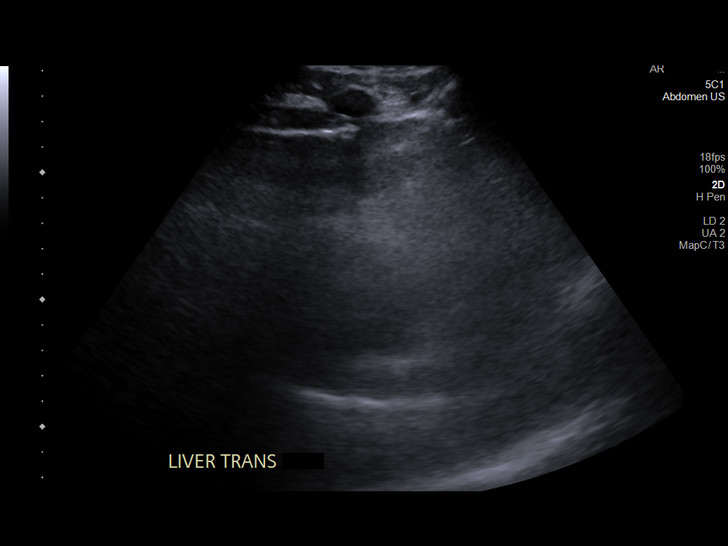
[im 9/31]
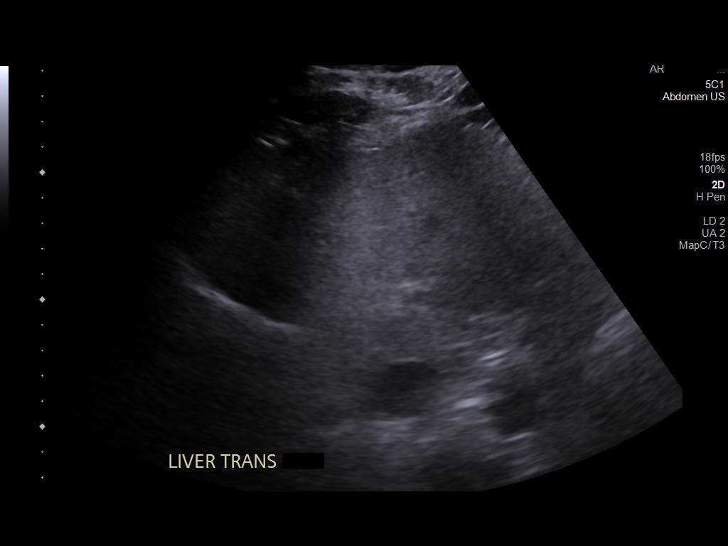
[im 12/31]
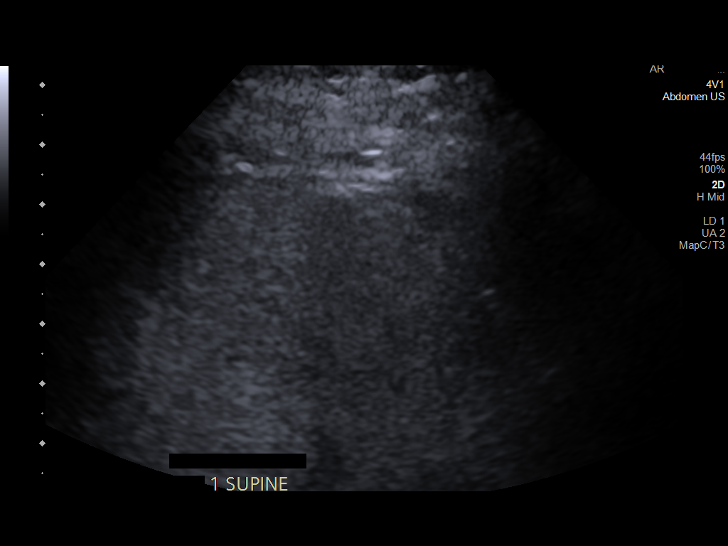
[im 14/31]
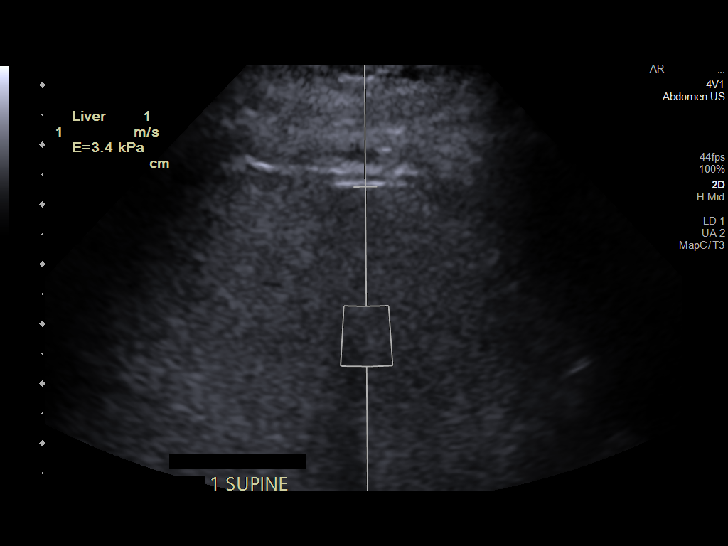
[im 17/31]
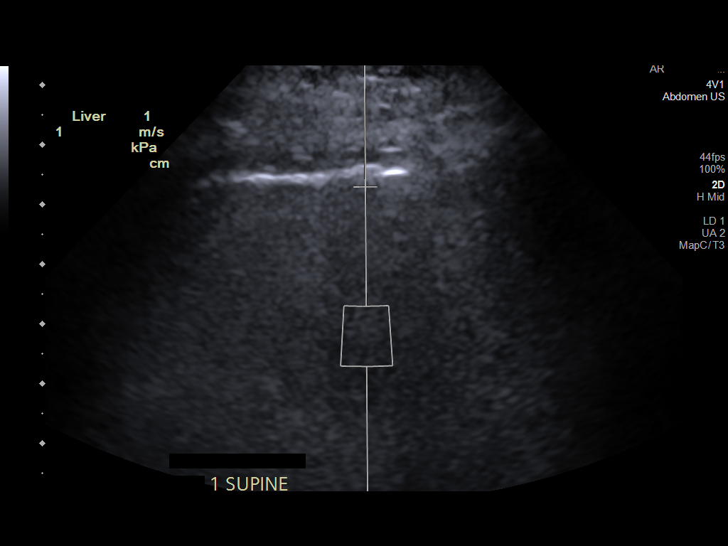
[im 19/31]
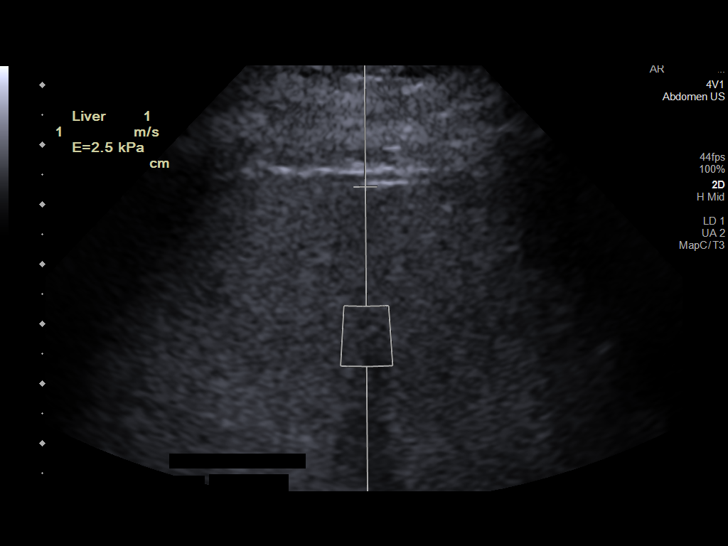
[im 22/31]
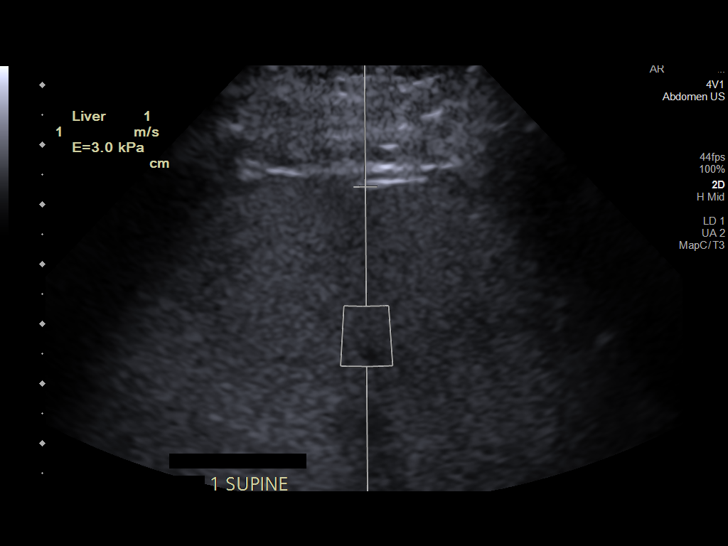
[im 24/31]
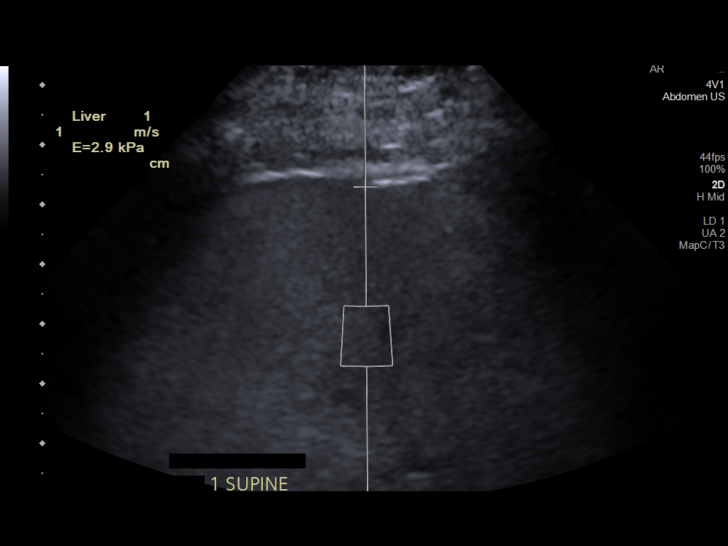
[im 27/31]
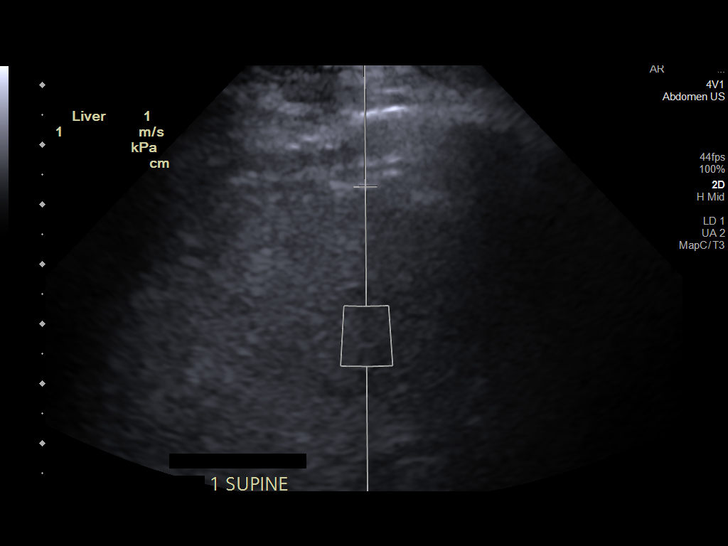
[im 29/31]
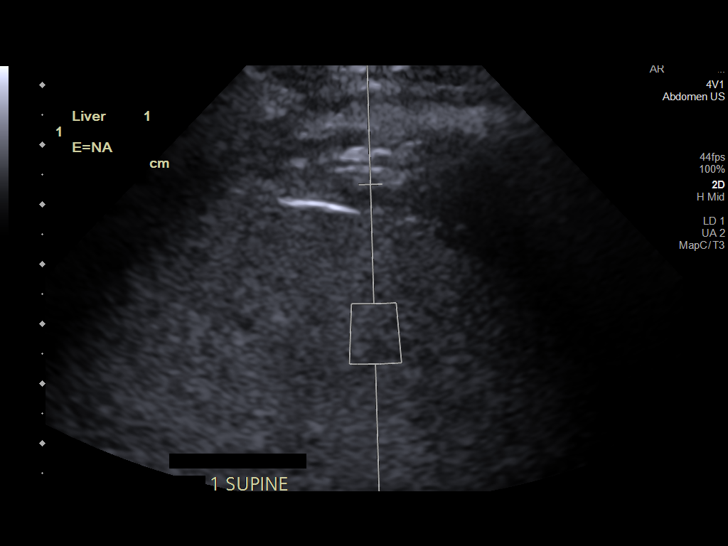

[12 of 25 positions shown; findings below may reference images not displayed]

FINDINGS: Liver: Hyperechoic hepatic parenchyma. No focal hepatic lesion is
seen. Portal vein is patent on color Doppler imaging with normal
direction of blood flow towards the liver.

ULTRASOUND HEPATIC ELASTOGRAPHY

Device: Siemens Helix VTQ

Patient position: Supine

Transducer: [id number]

Number of measurements: 10

Hepatic segment:  8

Median kPa:

IQR:

IQR/Median kPa ratio:

Data quality:  Good

Diagnostic category:  < or = 5 kPa: high probability of being normal

The use of hepatic elastography is applicable to patients with viral
hepatitis and non-alcoholic fatty liver disease. At this time, there
is insufficient data for the referenced cut-off values and use in
other causes of liver disease, including alcoholic liver disease.
Patients, however, may be assessed by elastography and serve as
their own reference standard/baseline.

In patients with non-alcoholic liver disease, the values suggesting
compensated advanced chronic liver disease (cACLD) may be lower, and
patients may need additional testing with elasticity results of [DATE]
kPa.

Please note that abnormal hepatic elasticity and shear wave
velocities may also be identified in clinical settings other than
with hepatic fibrosis, such as: acute hepatitis, elevated right
heart and central venous pressures including use of beta blockers,
Parson disease ([place] Kw[place]), infiltrative processes such as
mastocytosis/amyloidosis/infiltrative tumor/lymphoma, extrahepatic
cholestasis, with hyperemia in the post-prandial state, and with
liver transplantation. Correlation with patient history, laboratory
data, and clinical condition recommended.

Diagnostic Categories:

< or =5 kPa: high probability of being normal

< or =9 kPa: in the absence of other known clinical signs, rules [DATE] kPa and ?13 kPa: suggestive of cACLD, but needs further testing

>13 kPa: highly suggestive of cACLD

> or =17 kPa: highly suggestive of cACLD with an increased
probability of clinically significant portal hypertension
IMPRESSION: ULTRASOUND LIVER:

Hyperechoic hepatic parenchyma, nonspecific but suggesting hepatic
steatosis.

ULTRASOUND HEPATIC ELASTOGRAPHY:

Median kPa:

Diagnostic category:  < or = 5 kPa: high probability of being normal

## 2022-06-30 ENCOUNTER — Encounter: Payer: Self-pay | Admitting: Orthopedic Surgery

## 2022-06-30 ENCOUNTER — Other Ambulatory Visit: Payer: Self-pay | Admitting: Orthopedic Surgery

## 2022-06-30 DIAGNOSIS — M25511 Pain in right shoulder: Secondary | ICD-10-CM

## 2022-07-22 ENCOUNTER — Ambulatory Visit
Admission: RE | Admit: 2022-07-22 | Discharge: 2022-07-22 | Disposition: A | Discharge status: Home / Self Care | Payer: 59 | Source: Ambulatory Visit | Attending: Orthopedic Surgery | Admitting: Orthopedic Surgery

## 2022-07-22 DIAGNOSIS — M25511 Pain in right shoulder: Secondary | ICD-10-CM

## 2023-01-26 ENCOUNTER — Other Ambulatory Visit: Payer: Self-pay | Admitting: Orthopaedic Surgery

## 2023-01-26 DIAGNOSIS — M75102 Unspecified rotator cuff tear or rupture of left shoulder, not specified as traumatic: Secondary | ICD-10-CM

## 2023-02-18 ENCOUNTER — Ambulatory Visit
Admission: RE | Admit: 2023-02-18 | Discharge: 2023-02-18 | Disposition: A | Discharge status: Home / Self Care | Payer: 59 | Source: Ambulatory Visit | Attending: Orthopaedic Surgery | Admitting: Orthopaedic Surgery

## 2023-02-18 DIAGNOSIS — M75102 Unspecified rotator cuff tear or rupture of left shoulder, not specified as traumatic: Secondary | ICD-10-CM

## 2023-02-23 ENCOUNTER — Other Ambulatory Visit: Payer: Self-pay | Admitting: Orthopaedic Surgery

## 2023-02-23 DIAGNOSIS — Z01818 Encounter for other preprocedural examination: Secondary | ICD-10-CM

## 2023-03-01 ENCOUNTER — Ambulatory Visit
Admission: RE | Admit: 2023-03-01 | Discharge: 2023-03-01 | Disposition: A | Discharge status: Home / Self Care | Payer: 59 | Source: Ambulatory Visit | Attending: Orthopaedic Surgery | Admitting: Orthopaedic Surgery

## 2023-03-01 DIAGNOSIS — Z01818 Encounter for other preprocedural examination: Secondary | ICD-10-CM
